# Patient Record
Sex: Male | Born: 1995
Health system: Southern US, Community
[De-identification: ages and names within clinical notes are randomized; demographics above are authoritative.]

## PROBLEM LIST (undated history)

## (undated) DIAGNOSIS — S060X9A Concussion with loss of consciousness of unspecified duration, initial encounter: Secondary | ICD-10-CM

## (undated) DIAGNOSIS — S060XAA Concussion with loss of consciousness status unknown, initial encounter: Secondary | ICD-10-CM

## (undated) DIAGNOSIS — Z8709 Personal history of other diseases of the respiratory system: Secondary | ICD-10-CM

## (undated) DIAGNOSIS — J329 Chronic sinusitis, unspecified: Secondary | ICD-10-CM

---
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
## 2015-09-21 ENCOUNTER — Encounter (HOSPITAL_COMMUNITY): Payer: Self-pay

## 2015-09-21 ENCOUNTER — Inpatient Hospital Stay (HOSPITAL_COMMUNITY)
Admission: AD | Admit: 2015-09-21 | Discharge: 2015-09-28 | DRG: 153 | Disposition: A | Discharge status: Skilled Nursing Facility | Payer: BLUE CROSS/BLUE SHIELD | Source: Other Acute Inpatient Hospital | Attending: Internal Medicine | Admitting: Internal Medicine

## 2015-09-21 DIAGNOSIS — M6282 Rhabdomyolysis: Secondary | ICD-10-CM | POA: Diagnosis present

## 2015-09-21 DIAGNOSIS — M791 Myalgia: Secondary | ICD-10-CM

## 2015-09-21 DIAGNOSIS — K5900 Constipation, unspecified: Secondary | ICD-10-CM | POA: Diagnosis present

## 2015-09-21 DIAGNOSIS — L0390 Cellulitis, unspecified: Secondary | ICD-10-CM | POA: Diagnosis present

## 2015-09-21 DIAGNOSIS — L040 Acute lymphadenitis of face, head and neck: Secondary | ICD-10-CM | POA: Diagnosis present

## 2015-09-21 DIAGNOSIS — R51 Headache: Secondary | ICD-10-CM

## 2015-09-21 DIAGNOSIS — L049 Acute lymphadenitis, unspecified: Secondary | ICD-10-CM

## 2015-09-21 DIAGNOSIS — K759 Inflammatory liver disease, unspecified: Secondary | ICD-10-CM

## 2015-09-21 DIAGNOSIS — R509 Fever, unspecified: Secondary | ICD-10-CM

## 2015-09-21 DIAGNOSIS — F1290 Cannabis use, unspecified, uncomplicated: Secondary | ICD-10-CM | POA: Diagnosis present

## 2015-09-21 DIAGNOSIS — J029 Acute pharyngitis, unspecified: Principal | ICD-10-CM | POA: Diagnosis present

## 2015-09-21 DIAGNOSIS — B9789 Other viral agents as the cause of diseases classified elsewhere: Secondary | ICD-10-CM

## 2015-09-21 DIAGNOSIS — R740 Nonspecific elevation of levels of transaminase and lactic acid dehydrogenase [LDH]: Secondary | ICD-10-CM | POA: Diagnosis not present

## 2015-09-21 DIAGNOSIS — T796XXA Traumatic ischemia of muscle, initial encounter: Secondary | ICD-10-CM | POA: Diagnosis not present

## 2015-09-21 DIAGNOSIS — G039 Meningitis, unspecified: Secondary | ICD-10-CM | POA: Insufficient documentation

## 2015-09-21 DIAGNOSIS — B349 Viral infection, unspecified: Secondary | ICD-10-CM | POA: Diagnosis not present

## 2015-09-21 DIAGNOSIS — K0889 Other specified disorders of teeth and supporting structures: Secondary | ICD-10-CM

## 2015-09-21 DIAGNOSIS — R109 Unspecified abdominal pain: Secondary | ICD-10-CM

## 2015-09-21 DIAGNOSIS — M542 Cervicalgia: Secondary | ICD-10-CM

## 2015-09-21 DIAGNOSIS — R630 Anorexia: Secondary | ICD-10-CM

## 2015-09-21 DIAGNOSIS — R52 Pain, unspecified: Secondary | ICD-10-CM

## 2015-09-21 DIAGNOSIS — J028 Acute pharyngitis due to other specified organisms: Secondary | ICD-10-CM

## 2015-09-21 HISTORY — DX: Chronic sinusitis, unspecified: J32.9

## 2015-09-21 HISTORY — DX: Concussion with loss of consciousness of unspecified duration, initial encounter: S06.0X9A

## 2015-09-21 HISTORY — DX: Concussion with loss of consciousness status unknown, initial encounter: S06.0XAA

## 2015-09-21 HISTORY — DX: Personal history of other diseases of the respiratory system: Z87.09

## 2015-09-21 LAB — COMPREHENSIVE METABOLIC PANEL
ALBUMIN: 3.6 g/dL (ref 3.5–5.0)
ALK PHOS: 56 U/L (ref 38–126)
ALT: 76 U/L — AB (ref 17–63)
ANION GAP: 11 (ref 5–15)
AST: 115 U/L — AB (ref 15–41)
BILIRUBIN TOTAL: 1 mg/dL (ref 0.3–1.2)
BUN: 8 mg/dL (ref 6–20)
CALCIUM: 9.6 mg/dL (ref 8.9–10.3)
CO2: 27 mmol/L (ref 22–32)
CREATININE: 1.2 mg/dL (ref 0.61–1.24)
Chloride: 100 mmol/L — ABNORMAL LOW (ref 101–111)
GFR calc Af Amer: >60 mL/min (ref >60)
GFR calc non Af Amer: >60 mL/min (ref >60)
GLUCOSE: 160 mg/dL — AB (ref 65–99)
Potassium: 4.9 mmol/L (ref 3.5–5.1)
Sodium: 138 mmol/L (ref 135–145)
TOTAL PROTEIN: 6.9 g/dL (ref 6.5–8.1)

## 2015-09-21 LAB — CBC
HEMATOCRIT: 41.1 % (ref 39.0–52.0)
HEMOGLOBIN: 14.6 g/dL (ref 13.0–17.0)
MCH: 29.1 pg (ref 26.0–34.0)
MCHC: 35.5 g/dL (ref 30.0–36.0)
MCV: 81.9 fL (ref 78.0–100.0)
Platelets: 223 10*3/uL (ref 150–400)
RBC: 5.02 MIL/uL (ref 4.22–5.81)
RDW: 13.3 % (ref 11.5–15.5)
WBC: 11.2 10*3/uL — ABNORMAL HIGH (ref 4.0–10.5)

## 2015-09-21 LAB — CK: Total CK: 3658 U/L — ABNORMAL HIGH (ref 49–397)

## 2015-09-21 LAB — HIV ANTIBODY (ROUTINE TESTING W REFLEX): HIV Screen 4th Generation wRfx: NONREACTIVE

## 2015-09-21 MED ORDER — DOXYCYCLINE HYCLATE 100 MG IV SOLR
100.0000 mg | Freq: Two times a day (BID) | INTRAVENOUS | Status: DC
  Filled 2015-09-21: qty 100

## 2015-09-21 MED ORDER — SODIUM CHLORIDE 0.9 % IV SOLN
INTRAVENOUS | Status: DC
  Administered 2015-09-21 – 2015-09-24 (×5): via INTRAVENOUS

## 2015-09-21 MED ORDER — OXYCODONE HCL 5 MG PO TABS
5.0000 mg | ORAL_TABLET | ORAL | Status: DC | PRN
  Administered 2015-09-21 (×3): 5 mg via ORAL
  Filled 2015-09-21 (×4): qty 1

## 2015-09-21 MED ORDER — ONDANSETRON HCL 4 MG/2ML IJ SOLN
4.0000 mg | Freq: Four times a day (QID) | INTRAMUSCULAR | Status: DC | PRN
Start: 2015-09-21 — End: 2015-09-28

## 2015-09-21 MED ORDER — HYDROMORPHONE HCL 1 MG/ML IJ SOLN
1.0000 mg | INTRAMUSCULAR | Status: DC | PRN
  Administered 2015-09-21 (×4): 1 mg via INTRAVENOUS
  Filled 2015-09-21 (×4): qty 1

## 2015-09-21 MED ORDER — ACETAMINOPHEN 650 MG RE SUPP: 650.0000 mg | Freq: Four times a day (QID) | RECTAL | Status: DC | PRN

## 2015-09-21 MED ORDER — VANCOMYCIN HCL IN DEXTROSE 1-5 GM/200ML-% IV SOLN
1000.0000 mg | Freq: Three times a day (TID) | INTRAVENOUS | Status: DC
  Administered 2015-09-21: 1000 mg via INTRAVENOUS
  Filled 2015-09-21 (×3): qty 200

## 2015-09-21 MED ORDER — ONDANSETRON HCL 4 MG PO TABS: 4.0000 mg | ORAL_TABLET | Freq: Four times a day (QID) | ORAL | Status: DC | PRN

## 2015-09-21 MED ORDER — ACETAMINOPHEN 325 MG PO TABS: 650.0000 mg | ORAL_TABLET | Freq: Four times a day (QID) | ORAL | Status: DC | PRN

## 2015-09-21 MED ORDER — METHOCARBAMOL 500 MG PO TABS
500.0000 mg | ORAL_TABLET | Freq: Four times a day (QID) | ORAL | Status: DC | PRN
  Administered 2015-09-22: 500 mg via ORAL
  Filled 2015-09-21: qty 1

## 2015-09-21 MED ORDER — SODIUM CHLORIDE 0.9% FLUSH
3.0000 mL | Freq: Two times a day (BID) | INTRAVENOUS | Status: DC
  Administered 2015-09-21 – 2015-09-27 (×11): 3 mL via INTRAVENOUS

## 2015-09-21 MED ORDER — DEXTROSE 5 % IV SOLN
2.0000 g | Freq: Two times a day (BID) | INTRAVENOUS | Status: DC
  Administered 2015-09-21: 2 g via INTRAVENOUS
  Filled 2015-09-21 (×2): qty 2

## 2015-09-21 MED ORDER — ENOXAPARIN SODIUM 40 MG/0.4ML ~~LOC~~ SOLN
40.0000 mg | SUBCUTANEOUS | Status: DC
  Administered 2015-09-21 – 2015-09-27 (×6): 40 mg via SUBCUTANEOUS
  Filled 2015-09-21 (×7): qty 0.4

## 2015-09-21 MED ORDER — DEXTROSE 5 % IV SOLN
10.0000 mg/kg | Freq: Three times a day (TID) | INTRAVENOUS | Status: DC
  Administered 2015-09-21: 625 mg via INTRAVENOUS
  Filled 2015-09-21 (×3): qty 12.5

## 2015-09-21 NOTE — Progress Notes (Signed)
ANTIBIOTIC CONSULT NOTE - INITIAL  Pharmacy Consult for Rocephin / Vancomycin Indication: meningitis  Patient Measurements: Height: 5\' 10"  (177.8 cm) Weight: 138 lb 3.2 oz (62.687 kg) (Scale A) IBW/kg (Calculated) : 73   Vital Signs: Temp: 98.2 F (36.8 C) (01/28 0100) Temp Source: Oral (01/28 0100) BP: 139/75 mmHg (01/28 0100) Pulse Rate: 83 (01/28 0100)   Labs: No results for input(s): WBC, HGB, PLT, LABCREA, CREATININE in the last 72 hours. CrCl cannot be calculated (Patient has no serum creatinine result on file.). No results for input(s): VANCOTROUGH, VANCOPEAK, VANCORANDOM, GENTTROUGH, GENTPEAK, GENTRANDOM, TOBRATROUGH, TOBRAPEAK, TOBRARND, AMIKACINPEAK, AMIKACINTROU, AMIKACIN in the last 72 hours.   Microbiology: No results found for this or any previous visit (from the past 720 hour(s)).  Medical History: History reviewed. No pertinent past medical history.  Assessment: 20 year old male to begin Rocephin and Vancomycin for possible meningitis  Goal of Therapy:  Vancomycin trough level 15-20 mcg/ml  Appropriate Rocephin dosing  Plan:  Rocephin 2 grams iv Q 12 hours Vancomycin 1 gram iv Q 8 hours Follow up Scr, plan  Thank you Anette Guarneri, PharmD [phone]  09/21/2015,3:49 AM

## 2015-09-21 NOTE — H&P (Signed)
History and Physical  Patient Name: William Cordova     [id number]    DOB: Feb 14, 1996    DOA: 09/21/2015 Referring physician: Gloriann Loan, DO at [facility] PCP: Angelina Sheriff., MD      Chief Complaint: Headache   HPI: William Cordova is a 20 y.o. male with a past medical history significant for concussion who presents with 3 days headache and fever.  The patient was in his usual state of health until about three days ago when he developed posterior neck pain.  This progressed the next day to headache and low grade fever, and then today to severe neck ache, back ache, pain with swallowing, headache, and fever alternating with drenching sweats and chills.  The pain was more severe than a broken bone, so he had his girlfriend drive him home from college at [employer] to go to the hospital.    Of note, the patient was in the [place] on a cruise ship three weeks ago.  During that time, he did excursions onto the island, including one cave exploration with a group and guide, in close proximity to bats.  He had a sore throat during that trip, but nothing else.  He has had no other travel in the last year outside the [place] ([place], [place], and [place]).  No recent mountain travel.  No illicit drugs other than THC.  In the ED, the patient had fever to 101F, mild tachycardia, mildly elevated transaminases and leukocytosis.  An LP was traumatic with only 1 WBC and normal glucose and protein.  This was sent for culture and HSV PCR, and empiric vancomycin and ceftriaxone were started.  The case was discussed with [facility] by phone, who recommended transfer for evaluation.     Review of Systems:  Pt complains of neck pain, back pain, thigh pain, pain with walking, pain with any movement, fever, drenching sweats, abdominal pain.   Pt denies any joint pain, rashes.  All other systems negative except as just noted or noted in the history of present illness.  No Known Allergies  Prior to Admission medications     Not on File    Past Medical History  Diagnosis Date  . Concussion     History reviewed. No pertinent past surgical history.  Family history: family history includes Cancer in his paternal grandmother; Heart attack in his paternal grandfather.  Social History: Patient lives in [place] and is a Ship broker at [employer].  He does not smoke.  THC use but not other illicit drugs.  Travel as described above.       Physical Exam: BP 139/75 mmHg  Pulse 83  Temp(Src) 98.2 F (36.8 C) (Oral)  Resp 15  Ht 5\' 10"  (1.778 m)  Wt 62.687 kg (138 lb 3.2 oz)  BMI 19.83 kg/m2  SpO2 96% General appearance: Well-developed, adult male, alert and in mild distress from pain.   Eyes: Anicteric, conjunctiva pink, lids and lashes normal.     ENT: No nasal deformity, discharge, or epistaxis.  OP moist without lesions.  Painful to open mouth far. Lymph: No cervical or supraclavicular lymphadenopathy. Skin: Warm and dry.  No jaundice.  Question faint red papular rash on chest, behind ears.  None on extremities, hands, legs.  Mother points out three old hypopigmented macules/patches on back. Cardiac: RRR, nl S1-S2, no murmurs appreciated.  Capillary refill is brisk.  No LE edema.  Radial and DP pulses 2+ and symmetric. Respiratory: Normal respiratory rate and rhythm.  CTAB  without rales or wheezes. Abdomen: Abdomen soft without rigidity.  Mild RUQ TTP with lots of voluntary guarding. No ascites, distension.   MSK: No deformities or effusions. Neuro: Sensorium intact and responding to questions, attention normal.  Speech is fluent.  Moves all extremities equally and with normal coordination.    Psych: Behavior appropriate.  Affect in pain.  No evidence of aural or visual hallucinations or delusions.       Labs on Admission:  The metabolic panel shows normal sodium, potassium, bicarbonate, and renal function. Osmolalities somewhat low. Urinalysis is clear.  UDS positive for THC only. Lactic acid level  normal. Lumbar puncture shows 1 CSF WBC, many RBCs, Gram stain negative. Flu and strep rapid test are negative.  AST and ALT are 163 and 108 U/L, respectively. Total bilirubin mildly elevated. The complete blood count shows leukocytosis without anemia or thrombocytopenia.   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  CT head without contrast at outside hospital 09/21/2015: NAICP     Assessment/Plan 1. Headache, fever, sweats and body ache:  This is new.  The differential includes viral meningitis, HSV encephalitis, bacterial meningitis (doubted given tap, but coverage empirically for now), and tropical fevers (chikungunya, Dengue, Zika, malaria).  CT of the head and abdomen are unremarkable.  LP traumatic, culture and HSV PCR pending. -Supportive care with MIVF and pain control -Follow culture data and HSV PCR from [facility] -Thin smears -HIV -Consult to ID, appreciate cares   2. LFTs:  Suspect due to #1 above. -Trend CMP      DVT PPx: Lovenox Diet: Regular Consultants: ID Code Status: Full Family Communication: Mother, present at bedside  Medical decision making: What exists of the patient's previous chart from today at [facility] was reviewed. Patient seen 4:34 AM on 09/21/2015.  Disposition Plan:  I recommend admission to telemetry.  Clinical condition: stable.  Anticipate work up with infectious disease guidance, empiric coverage for now, de-escalation if possible.      Edwin Dada [facility] Pager [phone]

## 2015-09-21 NOTE — Progress Notes (Addendum)
Patient admmited after midnight- check CK (recently increased the amount of weights/intensity of exercise) -prob viral illness -labs in AM  William Hetz DO  CK level elevated- will increase IVF -updated patient and nurse  Later called by mother wanting a CT scan of neck apparently.  Explained for the 3rd time that is not yet indicated.

## 2015-09-21 NOTE — Consult Note (Signed)
    [place] for Infectious Disease       Reason for Consult: fever    Referring Physician: Dr. Eliseo Squires  Principal Problem:   Meningitis   . enoxaparin (LOVENOX) injection  40 mg Subcutaneous Q24H  . sodium chloride flush  3 mL Intravenous Q12H    Recommendations: Supportive care Will add on CMV, EBV   Assessment: He has febrile illness with myalgias, headache diffusely, poor po, mild transaminitis, abdominal discomfort without diarrhea, sore throat.  DDx is broad but largely viral etiology.  Not c/w typhoid fever, possible EBV/CMV, ILI.  Dengue possible but less likely with no overt joint complaints or periorbital headache, chikungunya not likely without joint swelling.  Meningitis r/o with no WBC on CSF, not c/w encephalitis with no confusion, normal glucose and protein.     Antibiotics: Has received ceftriaxone, steroids and vancomyin  HPI: William Cordova is a 20 y.o. male with no known past medical history who presented to [facility] with fever, diffuse muscle aches, sore throat, abdominal discomfort.  Has been ongoing for 2 days, no sick contacts, no diarrhea, no arthralgias, neck pain with headache diffusely, some nausea but no vomiting.  Recently traveled to [place] on cruise, spent some time on the island and went to 2 cities, cave.  Does not recall any mosquito bites.   Labs, ct reviewed from [facility].  CSF WBC 1, normal glucose and protein.    Review of Systems:  Constitutional: positive for fevers, chills, sweats, fatigue, malaise and anorexia Cardiovascular: negative except for fatigue Gastrointestinal: negative for diarrhea Musculoskeletal: positive for myalgias, neck pain and back pain, negative for arthralgias and stiff joints All other systems reviewed and are negative   Past Medical History  Diagnosis Date  . Concussion     Social History  Substance Use Topics  . Smoking status: Never Smoker   . Smokeless tobacco: None  . Alcohol Use: Yes    Family  History  Problem Relation Age of Onset  . Heart attack Paternal Grandfather   . Cancer Paternal Grandmother     No Known Allergies  Physical Exam: Constitutional: in no apparent distress and alert  Filed Vitals:   09/21/15 0511 09/21/15 0812  BP: 105/49 108/76  Pulse: 63 62  Temp: 98.3 F (36.8 C) 98 F (36.7 C)  Resp: 15 16   EYES: anicteric ENMT: no thrush, cannot open mouth without pain Cardiovascular: Cor RRR and No murmurs Respiratory: CTA B; normal respiratory effort GI: Bowel sounds are normal, liver is not enlarged, spleen is not enlarged Musculoskeletal: no pedal edema noted Skin: negatives: no rash Hematologic: some anterior cervical lad  Lab Results  Component Value Date   WBC 11.2* 09/21/2015   HGB 14.6 09/21/2015   HCT 41.1 09/21/2015   MCV 81.9 09/21/2015   PLT 223 09/21/2015    Lab Results  Component Value Date   CREATININE 1.20 09/21/2015   BUN 8 09/21/2015   NA 138 09/21/2015   K 4.9 09/21/2015   CL 100* 09/21/2015   CO2 27 09/21/2015    Lab Results  Component Value Date   ALT 76* 09/21/2015   AST 115* 09/21/2015   ALKPHOS 56 09/21/2015     Microbiology: No results found for this or any previous visit (from the past 240 hour(s)).  Scharlene Gloss, [place] for Infectious Disease [place] [email or web address] [phone] pager  [phone] cell 09/21/2015, 11:02 AM

## 2015-09-22 ENCOUNTER — Inpatient Hospital Stay (HOSPITAL_COMMUNITY): Payer: BLUE CROSS/BLUE SHIELD

## 2015-09-22 ENCOUNTER — Encounter (HOSPITAL_COMMUNITY): Payer: Self-pay | Admitting: Internal Medicine

## 2015-09-22 DIAGNOSIS — B9789 Other viral agents as the cause of diseases classified elsewhere: Secondary | ICD-10-CM

## 2015-09-22 DIAGNOSIS — M542 Cervicalgia: Secondary | ICD-10-CM

## 2015-09-22 DIAGNOSIS — J028 Acute pharyngitis due to other specified organisms: Secondary | ICD-10-CM

## 2015-09-22 DIAGNOSIS — M6282 Rhabdomyolysis: Secondary | ICD-10-CM

## 2015-09-22 LAB — EBV AB TO VIRAL CAPSID AG PNL, IGG+IGM: EBV VCA IGG: 34.3 U/mL — AB (ref 0.0–17.9)

## 2015-09-22 LAB — HEPATITIS A ANTIBODY, IGM: Hep A IgM: NEGATIVE

## 2015-09-22 LAB — COMPREHENSIVE METABOLIC PANEL
ALBUMIN: 2.7 g/dL — AB (ref 3.5–5.0)
ALK PHOS: 42 U/L (ref 38–126)
ALT: 54 U/L (ref 17–63)
ANION GAP: 11 (ref 5–15)
AST: 69 U/L — AB (ref 15–41)
BUN: 6 mg/dL (ref 6–20)
CO2: 26 mmol/L (ref 22–32)
Calcium: 8.7 mg/dL — ABNORMAL LOW (ref 8.9–10.3)
Chloride: 104 mmol/L (ref 101–111)
Creatinine, Ser: 1.05 mg/dL (ref 0.61–1.24)
GFR calc Af Amer: >60 mL/min (ref >60)
GFR calc non Af Amer: >60 mL/min (ref >60)
GLUCOSE: 96 mg/dL (ref 65–99)
POTASSIUM: 4.3 mmol/L (ref 3.5–5.1)
SODIUM: 141 mmol/L (ref 135–145)
Total Bilirubin: 0.4 mg/dL (ref 0.3–1.2)
Total Protein: 5.1 g/dL — ABNORMAL LOW (ref 6.5–8.1)

## 2015-09-22 LAB — CBC
HEMATOCRIT: 33.4 % — AB (ref 39.0–52.0)
HEMOGLOBIN: 11.7 g/dL — AB (ref 13.0–17.0)
MCH: 29 pg (ref 26.0–34.0)
MCHC: 35 g/dL (ref 30.0–36.0)
MCV: 82.9 fL (ref 78.0–100.0)
Platelets: 201 10*3/uL (ref 150–400)
RBC: 4.03 MIL/uL — AB (ref 4.22–5.81)
RDW: 13.3 % (ref 11.5–15.5)
WBC: 7.4 10*3/uL (ref 4.0–10.5)

## 2015-09-22 LAB — CMV IGM: CMV IgM: <30 AU/mL (ref 0.0–29.9)

## 2015-09-22 LAB — CK: Total CK: 2676 U/L — ABNORMAL HIGH (ref 49–397)

## 2015-09-22 MED ORDER — HYDROMORPHONE HCL 1 MG/ML IJ SOLN
0.5000 mg | Freq: Once | INTRAMUSCULAR | Status: AC
Start: 2015-09-22 — End: 2015-09-22
  Administered 2015-09-22: 0.5 mg via INTRAVENOUS
  Filled 2015-09-22: qty 1

## 2015-09-22 MED ORDER — CLINDAMYCIN HCL 300 MG PO CAPS
600.0000 mg | ORAL_CAPSULE | Freq: Four times a day (QID) | ORAL | Status: DC
  Administered 2015-09-22 – 2015-09-23 (×2): 600 mg via ORAL
  Filled 2015-09-22 (×2): qty 2

## 2015-09-22 MED ORDER — DIPHENHYDRAMINE HCL 25 MG PO CAPS
25.0000 mg | ORAL_CAPSULE | Freq: Four times a day (QID) | ORAL | Status: DC | PRN
  Administered 2015-09-22 – 2015-09-23 (×2): 25 mg via ORAL
  Filled 2015-09-22 (×2): qty 1

## 2015-09-22 MED ORDER — HYDROMORPHONE HCL 1 MG/ML IJ SOLN
0.5000 mg | INTRAMUSCULAR | Status: DC | PRN
  Administered 2015-09-22 – 2015-09-26 (×17): 0.5 mg via INTRAVENOUS
  Filled 2015-09-22 (×18): qty 1

## 2015-09-22 MED ORDER — TRAMADOL HCL 50 MG PO TABS
50.0000 mg | ORAL_TABLET | Freq: Four times a day (QID) | ORAL | Status: DC | PRN
  Administered 2015-09-22: 50 mg via ORAL
  Filled 2015-09-22: qty 1

## 2015-09-22 MED ORDER — INFLUENZA VAC SPLIT QUAD 0.5 ML IM SUSY
0.5000 mL | PREFILLED_SYRINGE | INTRAMUSCULAR | Status: AC
  Administered 2015-09-27: 0.5 mL via INTRAMUSCULAR
  Filled 2015-09-22 (×2): qty 0.5

## 2015-09-22 MED ORDER — CLINDAMYCIN HCL 300 MG PO CAPS
300.0000 mg | ORAL_CAPSULE | Freq: Four times a day (QID) | ORAL | Status: DC
  Administered 2015-09-22: 300 mg via ORAL
  Filled 2015-09-22: qty 1

## 2015-09-22 MED ORDER — OXYCODONE-ACETAMINOPHEN 5-325 MG PO TABS
1.0000 | ORAL_TABLET | Freq: Four times a day (QID) | ORAL | Status: DC | PRN
  Administered 2015-09-23 – 2015-09-26 (×12): 1 via ORAL
  Filled 2015-09-22 (×12): qty 1

## 2015-09-22 MED ORDER — IBUPROFEN 600 MG PO TABS
600.0000 mg | ORAL_TABLET | Freq: Three times a day (TID) | ORAL | Status: DC
  Administered 2015-09-22 – 2015-09-25 (×11): 600 mg via ORAL
  Filled 2015-09-22 (×12): qty 1

## 2015-09-22 MED ORDER — PHENOL 1.4 % MT LIQD
1.0000 | OROMUCOSAL | Status: DC | PRN
  Filled 2015-09-22: qty 177

## 2015-09-22 NOTE — Progress Notes (Signed)
Pt lying in bed, mother and two other members at the bedside spoke to the patient in detail about pain medication regimen. Awaiting kpad from portable. Verbalized understanding and requesting to talk with the MD once pts father is present MD made aware of concerns and requests.

## 2015-09-22 NOTE — Progress Notes (Addendum)
PROGRESS NOTE  William Cordova [id number] DOB: Apr 30, 1996 DOA: 09/21/2015 PCP: Angelina Sheriff., MD  Assessment/Plan: Headache, fever, sweats and body ache:  Suspect viral- d/c all abx (no fever) -CMV, EBV -HIV negative - ID  Neck pain-severe in nature -no neurologic symptoms of numbness or weakness -CT scan done at [facility] demanding cervical MRI  MRI shows pharyngitis with small phlegmon- Start clindmyacin as has been on amoxicillin and augmentin in last 30 days -will touch base with ENT in AM -no stridor or drooling to suggest Retropharyngeal abscess  LFTs:  Suspect due rhabdo  rhabdo- recently increased work out -IVF -muscle relaxer -CK decreasing  THC counseled on cessation  Code Status: full Family Communication: patient Disposition Plan:    Consultants:  ID  Procedures:      HPI/Subjective: Patient did not want to wake up-- "I'm too tired"  Objective: Filed Vitals:   09/22/15 0459 09/22/15 1119  BP: 115/71 111/58  Pulse: 48 53  Temp: 98.3 F (36.8 C) 97.6 F (36.4 C)  Resp: 16 16    Intake/Output Summary (Last 24 hours) at 09/22/15 1211 Last data filed at 09/22/15 1033  Gross per 24 hour  Intake 4366.17 ml  Output   1575 ml  Net 2791.17 ml   Filed Weights   09/21/15 0100 09/22/15 0459  Weight: 62.687 kg (138 lb 3.2 oz) 66.089 kg (145 lb 11.2 oz)    Exam:   General:  sleeping  Cardiovascular: rrr  Respiratory: clear  Neck- tender to palpation  Data Reviewed: Basic Metabolic Panel:  Recent Labs Lab 09/21/15 0417 09/22/15 0345  NA 138 141  K 4.9 4.3  CL 100* 104  CO2 27 26  GLUCOSE 160* 96  BUN 8 6  CREATININE 1.20 1.05  CALCIUM 9.6 8.7*   Liver Function Tests:  Recent Labs Lab 09/21/15 0417 09/22/15 0345  AST 115* 69*  ALT 76* 54  ALKPHOS 56 42  BILITOT 1.0 0.4  PROT 6.9 5.1*  ALBUMIN 3.6 2.7*   No results for input(s): LIPASE, AMYLASE in the last 168 hours. No results for input(s):  AMMONIA in the last 168 hours. CBC:  Recent Labs Lab 09/21/15 0417 09/22/15 0345  WBC 11.2* 7.4  HGB 14.6 11.7*  HCT 41.1 33.4*  MCV 81.9 82.9  PLT 223 201   Cardiac Enzymes:  Recent Labs Lab 09/21/15 1245 09/22/15 0345  CKTOTAL 3658* 2676*   BNP (last 3 results) No results for input(s): BNP in the last 8760 hours.  ProBNP (last 3 results) No results for input(s): PROBNP in the last 8760 hours.  CBG: No results for input(s): GLUCAP in the last 168 hours.  No results found for this or any previous visit (from the past 240 hour(s)).   Studies: No results found.  Scheduled Meds: . enoxaparin (LOVENOX) injection  40 mg Subcutaneous Q24H  . sodium chloride flush  3 mL Intravenous Q12H   Continuous Infusions: . sodium chloride 150 mL/hr at 09/21/15 2317   Antibiotics Given (last 72 hours)    Date/Time Action Medication Dose Rate   09/21/15 0503 Given   cefTRIAXone (ROCEPHIN) 2 g in dextrose 5 % 50 mL IVPB 2 g 100 mL/hr   09/21/15 0602 Given   acyclovir (ZOVIRAX) 625 mg in dextrose 5 % 100 mL IVPB 625 mg 112.5 mL/hr   09/21/15 0659 Given  [Meds scheduled too close together]   vancomycin (VANCOCIN) IVPB 1000 mg/200 mL premix 1,000 mg 200 mL/hr      Active Problems:  Neck pain   Sore throat (viral)    Time spent: 25 min    [facility] Pager [phone] 7PM-7AM, please contact night-coverage at [email or web address], password [facility] 09/22/2015, 12:11 PM  LOS: 1 day

## 2015-09-22 NOTE — Progress Notes (Signed)
Pain has localized more the right side per patient- very tender to palpation and pain 10/10.  Family also reports numerous strep throat infections in past and talks of removing tonsils Eulogio Bear DO

## 2015-09-22 NOTE — Progress Notes (Signed)
Pt sitting up in bed with girlfriend in bed working on homework. Pain medication given

## 2015-09-23 LAB — CBC
HEMATOCRIT: 36.8 % — AB (ref 39.0–52.0)
Hemoglobin: 12.3 g/dL — ABNORMAL LOW (ref 13.0–17.0)
MCH: 27.9 pg (ref 26.0–34.0)
MCHC: 33.4 g/dL (ref 30.0–36.0)
MCV: 83.4 fL (ref 78.0–100.0)
Platelets: 209 10*3/uL (ref 150–400)
RBC: 4.41 MIL/uL (ref 4.22–5.81)
RDW: 13.2 % (ref 11.5–15.5)
WBC: 5.6 10*3/uL (ref 4.0–10.5)

## 2015-09-23 LAB — BASIC METABOLIC PANEL
ANION GAP: 8 (ref 5–15)
BUN: 6 mg/dL (ref 6–20)
CALCIUM: 8.7 mg/dL — AB (ref 8.9–10.3)
CO2: 25 mmol/L (ref 22–32)
Chloride: 107 mmol/L (ref 101–111)
Creatinine, Ser: 0.92 mg/dL (ref 0.61–1.24)
GFR calc Af Amer: >60 mL/min (ref >60)
GLUCOSE: 87 mg/dL (ref 65–99)
Potassium: 4.2 mmol/L (ref 3.5–5.1)
Sodium: 140 mmol/L (ref 135–145)

## 2015-09-23 LAB — CK: CK TOTAL: 1871 U/L — AB (ref 49–397)

## 2015-09-23 MED ORDER — SENNOSIDES-DOCUSATE SODIUM 8.6-50 MG PO TABS
1.0000 | ORAL_TABLET | Freq: Two times a day (BID) | ORAL | Status: DC
  Administered 2015-09-23 – 2015-09-28 (×11): 1 via ORAL
  Filled 2015-09-23 (×11): qty 1

## 2015-09-23 MED ORDER — CLINDAMYCIN PHOSPHATE 600 MG/50ML IV SOLN
600.0000 mg | Freq: Three times a day (TID) | INTRAVENOUS | Status: DC
  Administered 2015-09-23 – 2015-09-26 (×9): 600 mg via INTRAVENOUS
  Filled 2015-09-23 (×9): qty 50

## 2015-09-23 NOTE — Progress Notes (Signed)
PROGRESS NOTE  William Cordova [id number] DOB: Jul 13, 1996 DOA: 09/21/2015 PCP: Angelina Sheriff., MD  Assessment/Plan: Headache, fever, sweats and body ache:  -HIV negative - ID- has seen and signed off  Neck pain-severe in nature -no neurologic symptoms of numbness or weakness -CT scan done at [facility]  MRI shows pharyngitis with small phlegmon- Start clindmyacin as has been on amoxicillin and augmentin in last 30 days -appreciate ENT -no stridor or drooling to suggest Retropharyngeal abscess  LFTs:  Suspect due rhabdo -resolved  rhabdo- recently increased work out -IVF -CK decreasing  THC counseled on cessation  Code Status: full Family Communication: patient/mom Disposition Plan:    Consultants:  ID  ENT  Procedures:      HPI/Subjective: Said throat was better but then said it was about the same Eating more   Objective: Filed Vitals:   09/23/15 0519 09/23/15 1136  BP: 102/56 106/62  Pulse: 54 49  Temp: 97.7 F (36.5 C) 98.1 F (36.7 C)  Resp: 16 18    Intake/Output Summary (Last 24 hours) at 09/23/15 1150 Last data filed at 09/23/15 1143  Gross per 24 hour  Intake   3600 ml  Output   2700 ml  Net    900 ml   Filed Weights   09/21/15 0100 09/22/15 0459 09/23/15 0519  Weight: 62.687 kg (138 lb 3.2 oz) 66.089 kg (145 lb 11.2 oz) 66.271 kg (146 lb 1.6 oz)    Exam:   General:  Awake, doing home work on his computer  Neck- tender to palpation  No edema LE  Data Reviewed: Basic Metabolic Panel:  Recent Labs Lab 09/21/15 0417 09/22/15 0345 09/23/15 0530  NA 138 141 140  K 4.9 4.3 4.2  CL 100* 104 107  CO2 27 26 25   GLUCOSE 160* 96 87  BUN 8 6 6   CREATININE 1.20 1.05 0.92  CALCIUM 9.6 8.7* 8.7*   Liver Function Tests:  Recent Labs Lab 09/21/15 0417 09/22/15 0345  AST 115* 69*  ALT 76* 54  ALKPHOS 56 42  BILITOT 1.0 0.4  PROT 6.9 5.1*  ALBUMIN 3.6 2.7*   No results for input(s): LIPASE, AMYLASE in the last  168 hours. No results for input(s): AMMONIA in the last 168 hours. CBC:  Recent Labs Lab 09/21/15 0417 09/22/15 0345 09/23/15 0530  WBC 11.2* 7.4 5.6  HGB 14.6 11.7* 12.3*  HCT 41.1 33.4* 36.8*  MCV 81.9 82.9 83.4  PLT 223 201 209   Cardiac Enzymes:  Recent Labs Lab 09/21/15 1245 09/22/15 0345 09/23/15 0530  CKTOTAL 3658* 2676* 1871*   BNP (last 3 results) No results for input(s): BNP in the last 8760 hours.  ProBNP (last 3 results) No results for input(s): PROBNP in the last 8760 hours.  CBG: No results for input(s): GLUCAP in the last 168 hours.  No results found for this or any previous visit (from the past 240 hour(s)).   Studies: Mr Cervical Spine Wo Contrast  09/22/2015  CLINICAL DATA:  Fever and diffuse neck pain, 2 days duration. EXAM: MRI CERVICAL SPINE WITHOUT CONTRAST TECHNIQUE: Multiplanar, multisequence MR imaging of the cervical spine was performed. No intravenous contrast was administered. COMPARISON:  None. FINDINGS: Alignment of the spine is normal. No degenerative change. Spinal canal and foramina are widely patent. No cord lesion. Abnormal fluid in the retropharyngeal/prevertebral space extending from C2 to C6 consistent with pharyngitis. Reactive cervical lymphadenopathy is noted including a suppurative node on the right between the vessels and the longus coli  muscle measuring 3 cm in length with a transverse diameter of 14 mm, consistent with the retropharyngeal node of Rouviere. IMPRESSION: Suppurative retropharyngeal lymph node of Rouviere on the right with retropharyngeal phlegmonous inflammation/effusion. Electronically Signed   By: Nelson Chimes M.D.   On: 09/22/2015 14:44    Scheduled Meds: . clindamycin (CLEOCIN) IV  600 mg Intravenous 3 times per day  . enoxaparin (LOVENOX) injection  40 mg Subcutaneous Q24H  . ibuprofen  600 mg Oral TID  . Influenza vac split quadrivalent PF  0.5 mL Intramuscular Tomorrow-1000  . senna-docusate  1 tablet Oral  BID  . sodium chloride flush  3 mL Intravenous Q12H   Continuous Infusions: . sodium chloride 150 mL/hr at 09/23/15 0302   Antibiotics Given (last 72 hours)    Date/Time Action Medication Dose Rate   09/21/15 0503 Given   cefTRIAXone (ROCEPHIN) 2 g in dextrose 5 % 50 mL IVPB 2 g 100 mL/hr   09/21/15 0602 Given   acyclovir (ZOVIRAX) 625 mg in dextrose 5 % 100 mL IVPB 625 mg 112.5 mL/hr   09/21/15 0659 Given  [Meds scheduled too close together]   vancomycin (VANCOCIN) IVPB 1000 mg/200 mL premix 1,000 mg 200 mL/hr   09/22/15 1606 Given   clindamycin (CLEOCIN) capsule 300 mg 300 mg    09/22/15 2357 Given   clindamycin (CLEOCIN) capsule 600 mg 600 mg    09/23/15 0535 Given   clindamycin (CLEOCIN) capsule 600 mg 600 mg       Active Problems:   Neck pain   Sore throat (viral)   Rhabdomyolysis    Time spent: 15 min    [facility] Pager [phone] 7PM-7AM, please contact night-coverage at [email or web address], password [facility] 09/23/2015, 11:50 AM  LOS: 2 days

## 2015-09-23 NOTE — Consult Note (Signed)
Reason for Consult:Neck pain, lymphadenitis Referring Physician: Hospitalist  William Cordova is an 20 y.o. male.  HPI: 20 year old male who developed posterior neck stiffness and pain five days ago.  He thought he had pulled a muscle working out.  By the next day, pain worsened significantly and he was unable to drive.  He was driven home from college the following day and was taken to the ER at [place] where Mono and flu testing were negative.  He had associated throat pain, pain with swallowing, low grade fever, and headache.  An LP was performed but was negative.  He was transferred for further evaluation and treated with empiric vanocomycin and Rocephin.  Pain continued through the weekend to the point of tears.  A cervical MRI was performed yesterday that demonstrated inflammatory lymphadenitis with a suppurative node in the right retropharynx and possible early fluid collection.  Today, pain continues with stiffness of the neck.  He notes tinnitus.  He slept several hours last night and last required pain medicine at 8 pm.  Past Medical History  Diagnosis Date  . Concussion   . Frequent sinus infections   . History of strep sore throat     History reviewed. No pertinent past surgical history.  Family History  Problem Relation Age of Onset  . Heart attack Paternal Grandfather   . Cancer Paternal Grandmother     Social History:  reports that he has never smoked. He does not have any smokeless tobacco history on file. He reports that he drinks alcohol. His drug history is not on file.  Allergies: No Known Allergies  Medications: I have reviewed the patient's current medications.  Results for orders placed or performed during the hospital encounter of 09/21/15 (from the past 48 hour(s))  EBV ab to viral capsid ag pnl, IgG+IgM     Status: Abnormal   Collection Time: 09/21/15 12:45 PM  Result Value Ref Range   EBV VCA IgG 34.3 (H) 0.0 - 17.9 U/mL    Comment: (NOTE)              Negative        <18.0                                 Equivocal 18.0 - 21.9                                 Positive        >21.9 Performed At: [facility] [place], [place] [id number] Lindon Romp MD IW:[id number]    EBV VCA IgM <36.0 0.0 - 35.9 U/mL    Comment: (NOTE)                                 Negative        <36.0                                 Equivocal 36.0 - 43.9                                 Positive        >43.9   CMV IgM  Status: None   Collection Time: 09/21/15 12:45 PM  Result Value Ref Range   CMV IgM <30.0 0.0 - 29.9 AU/mL    Comment: (NOTE)                                Negative         <30.0                                Equivocal  30.0 - 34.9                                Positive         >34.9 A positive result is generally indicative of acute infection, reactivation or persistent IgM production. Performed At: [facility] [place], [place] [id number] Lindon Romp MD OZ:[id number]   Hepatitis A antibody, IgM     Status: None   Collection Time: 09/21/15 12:45 PM  Result Value Ref Range   Hep A IgM Negative Negative    Comment: (NOTE) Performed At: [facility] [place], [place] [id number] Lindon Romp MD [id number]   CK     Status: Abnormal   Collection Time: 09/21/15 12:45 PM  Result Value Ref Range   Total CK 3658 (H) 49 - 397 U/L  Comprehensive metabolic panel     Status: Abnormal   Collection Time: 09/22/15  3:45 AM  Result Value Ref Range   Sodium 141 135 - 145 mmol/L   Potassium 4.3 3.5 - 5.1 mmol/L   Chloride 104 101 - 111 mmol/L   CO2 26 22 - 32 mmol/L   Glucose, Bld 96 65 - 99 mg/dL   BUN 6 6 - 20 mg/dL   Creatinine, Ser 1.05 0.61 - 1.24 mg/dL   Calcium 8.7 (L) 8.9 - 10.3 mg/dL   Total Protein 5.1 (L) 6.5 - 8.1 g/dL   Albumin 2.7 (L) 3.5 - 5.0 g/dL   AST 69 (H) 15 - 41 U/L   ALT 54 17 - 63 U/L   Alkaline Phosphatase 42 38 - 126  U/L   Total Bilirubin 0.4 0.3 - 1.2 mg/dL   GFR calc non Af Amer >60 >60 mL/min   GFR calc Af Amer >60 >60 mL/min    Comment: (NOTE) The eGFR has been calculated using the CKD EPI equation. This calculation has not been validated in all clinical situations. eGFR's persistently <60 mL/min signify possible Chronic Kidney Disease.    Anion gap 11 5 - 15  CK     Status: Abnormal   Collection Time: 09/22/15  3:45 AM  Result Value Ref Range   Total CK 2676 (H) 49 - 397 U/L  CBC     Status: Abnormal   Collection Time: 09/22/15  3:45 AM  Result Value Ref Range   WBC 7.4 4.0 - 10.5 K/uL   RBC 4.03 (L) 4.22 - 5.81 MIL/uL   Hemoglobin 11.7 (L) 13.0 - 17.0 g/dL   HCT 33.4 (L) 39.0 - 52.0 %   MCV 82.9 78.0 - 100.0 fL   MCH 29.0 26.0 - 34.0 pg   MCHC 35.0 30.0 - 36.0 g/dL   RDW 13.3 11.5 - 15.5 %   Platelets 201 150 - 400 K/uL  CBC     Status:  Abnormal   Collection Time: 09/23/15  5:30 AM  Result Value Ref Range   WBC 5.6 4.0 - 10.5 K/uL   RBC 4.41 4.22 - 5.81 MIL/uL   Hemoglobin 12.3 (L) 13.0 - 17.0 g/dL   HCT 36.8 (L) 39.0 - 52.0 %   MCV 83.4 78.0 - 100.0 fL   MCH 27.9 26.0 - 34.0 pg   MCHC 33.4 30.0 - 36.0 g/dL   RDW 13.2 11.5 - 15.5 %   Platelets 209 150 - 400 K/uL  Basic metabolic panel     Status: Abnormal   Collection Time: 09/23/15  5:30 AM  Result Value Ref Range   Sodium 140 135 - 145 mmol/L   Potassium 4.2 3.5 - 5.1 mmol/L   Chloride 107 101 - 111 mmol/L   CO2 25 22 - 32 mmol/L   Glucose, Bld 87 65 - 99 mg/dL   BUN 6 6 - 20 mg/dL   Creatinine, Ser 0.92 0.61 - 1.24 mg/dL   Calcium 8.7 (L) 8.9 - 10.3 mg/dL   GFR calc non Af Amer >60 >60 mL/min   GFR calc Af Amer >60 >60 mL/min    Comment: (NOTE) The eGFR has been calculated using the CKD EPI equation. This calculation has not been validated in all clinical situations. eGFR's persistently <60 mL/min signify possible Chronic Kidney Disease.    Anion gap 8 5 - 15  CK     Status: Abnormal   Collection Time: 09/23/15   5:30 AM  Result Value Ref Range   Total CK 1871 (H) 49 - 397 U/L    Mr Cervical Spine Wo Contrast  09/22/2015  CLINICAL DATA:  Fever and diffuse neck pain, 2 days duration. EXAM: MRI CERVICAL SPINE WITHOUT CONTRAST TECHNIQUE: Multiplanar, multisequence MR imaging of the cervical spine was performed. No intravenous contrast was administered. COMPARISON:  None. FINDINGS: Alignment of the spine is normal. No degenerative change. Spinal canal and foramina are widely patent. No cord lesion. Abnormal fluid in the retropharyngeal/prevertebral space extending from C2 to C6 consistent with pharyngitis. Reactive cervical lymphadenopathy is noted including a suppurative node on the right between the vessels and the longus coli muscle measuring 3 cm in length with a transverse diameter of 14 mm, consistent with the retropharyngeal node of Rouviere. IMPRESSION: Suppurative retropharyngeal lymph node of Rouviere on the right with retropharyngeal phlegmonous inflammation/effusion. Electronically Signed   By: Nelson Chimes M.D.   On: 09/22/2015 14:44    Review of Systems  HENT: Positive for sore throat and tinnitus.   Musculoskeletal: Positive for neck pain.  All other systems reviewed and are negative.  Blood pressure 102/56, pulse 54, temperature 97.7 F (36.5 C), temperature source Oral, resp. rate 16, height _0  (1.778 m), weight 66.271 kg (146 lb 1.6 oz), SpO2 97 %. Physical Exam  Constitutional: He is oriented to person, place, and time. He appears well-developed and well-nourished. No distress.  HENT:  Head: Normocephalic and atraumatic.  Right Ear: External ear normal.  Left Ear: External ear normal.  Nose: Nose normal.  Mouth/Throat: Oropharynx is clear and moist.  Eyes: Conjunctivae and EOM are normal. Pupils are equal, round, and reactive to light.  Neck:  Limited ROM.  Right zone 2 tenderness.  Cardiovascular: Normal rate.   Respiratory: Effort normal.  Musculoskeletal: Normal range of  motion.  Neurological: He is alert and oriented to person, place, and time. No cranial nerve deficit.  Skin: Skin is warm and dry.  Psychiatric: He has a normal  mood and affect. His behavior is normal. Judgment and thought content normal.    Assessment/Plan: Cervical lymphadenitis and retropharyngeal cellulitis I personally reviewed the cervical MRI yesterday and reviewed it with the radiologist.  There does not appear to be a drainable fluid collection based on that imaging result.  I recommended treatment with clindamycin which was started last night.  Intravenous dosing may be a better route.  I recommend antibiotic therapy and pain relief for the time being but may consider reimaging with CT in a couple of days if symptoms are not improving.  Normal WBC and lack of fever noted.  William Cordova 09/23/2015, 8:28 AM

## 2015-09-23 NOTE — Progress Notes (Signed)
Explained pain medication to patient and mother. Patient sitting up in bed. Pain medication given per PRN and scheduled order. Will continue to monitor patient to end of shift

## 2015-09-23 NOTE — Progress Notes (Signed)
Utilization review complete. Britainy Kozub RN CCM Case Mgmt phone [phone] 

## 2015-09-24 ENCOUNTER — Inpatient Hospital Stay (HOSPITAL_COMMUNITY): Payer: BLUE CROSS/BLUE SHIELD

## 2015-09-24 NOTE — Progress Notes (Signed)
Subjective: Overall, he feels no worse, perhaps slightly better.  Able to swallow better.  Neck remains stiff with pain.  Objective: Vital signs in last 24 hours: Temp:  [97.7 F (36.5 C)-98.1 F (36.7 C)] 97.7 F (36.5 C) (01/31 0600) Pulse Rate:  [49-58] 54 (01/31 0600) Resp:  [15-18] 15 (01/31 0600) BP: (106-113)/(54-62) 112/62 mmHg (01/31 0600) SpO2:  [99 %-100 %] 100 % (01/31 0600) Weight:  [66.633 kg (146 lb 14.4 oz)] 66.633 kg (146 lb 14.4 oz) (01/31 0600) Last BM Date: 09/20/15  Intake/Output from previous day: 01/30 0701 - 01/31 0700 In: 3394.3 [P.O.:1020; I.V.:2224.3; IV Piggyback:150] Out: [id number] [Urine:3475] Intake/Output this shift:    General appearance: alert, cooperative and no distress Neck: limited ROM, right upper neck tender.  Lab Results:   Recent Labs  09/22/15 0345 09/23/15 0530  WBC 7.4 5.6  HGB 11.7* 12.3*  HCT 33.4* 36.8*  PLT 201 209   BMET  Recent Labs  09/22/15 0345 09/23/15 0530  NA 141 140  K 4.3 4.2  CL 104 107  CO2 26 25  GLUCOSE 96 87  BUN 6 6  CREATININE 1.05 0.92  CALCIUM 8.7* 8.7*   PT/INR No results for input(s): LABPROT, INR in the last 72 hours. ABG No results for input(s): PHART, HCO3 in the last 72 hours.  Invalid input(s): PCO2, PO2  Studies/Results: Mr Cervical Spine Wo Contrast  09/22/2015  CLINICAL DATA:  Fever and diffuse neck pain, 2 days duration. EXAM: MRI CERVICAL SPINE WITHOUT CONTRAST TECHNIQUE: Multiplanar, multisequence MR imaging of the cervical spine was performed. No intravenous contrast was administered. COMPARISON:  None. FINDINGS: Alignment of the spine is normal. No degenerative change. Spinal canal and foramina are widely patent. No cord lesion. Abnormal fluid in the retropharyngeal/prevertebral space extending from C2 to C6 consistent with pharyngitis. Reactive cervical lymphadenopathy is noted including a suppurative node on the right between the vessels and the longus coli muscle measuring  3 cm in length with a transverse diameter of 14 mm, consistent with the retropharyngeal node of Rouviere. IMPRESSION: Suppurative retropharyngeal lymph node of Rouviere on the right with retropharyngeal phlegmonous inflammation/effusion. Electronically Signed   By: Nelson Chimes M.D.   On: 09/22/2015 14:44    Anti-infectives: Anti-infectives    Start     Dose/Rate Route Frequency Ordered Stop   09/23/15 1200  clindamycin (CLEOCIN) IVPB 600 mg     600 mg 100 mL/hr over 30 Minutes Intravenous 3 times per day 09/23/15 1054     09/23/15 0000  clindamycin (CLEOCIN) capsule 600 mg  Status:  Discontinued     600 mg Oral 4 times per day 09/22/15 1609 09/23/15 1054   09/22/15 1800  clindamycin (CLEOCIN) capsule 300 mg  Status:  Discontinued     300 mg Oral 4 times per day 09/22/15 1538 09/22/15 1609   09/21/15 0800  doxycycline (VIBRAMYCIN) 100 mg in dextrose 5 % 250 mL IVPB  Status:  Discontinued     100 mg 125 mL/hr over 120 Minutes Intravenous Every 12 hours 09/21/15 0338 09/21/15 0434   09/21/15 0600  acyclovir (ZOVIRAX) 625 mg in dextrose 5 % 100 mL IVPB  Status:  Discontinued     10 mg/kg  62.7 kg 112.5 mL/hr over 60 Minutes Intravenous 3 times per day 09/21/15 0434 09/21/15 0838   09/21/15 0500  cefTRIAXone (ROCEPHIN) 2 g in dextrose 5 % 50 mL IVPB  Status:  Discontinued     2 g 100 mL/hr over 30 Minutes Intravenous  Every 12 hours 09/21/15 0356 09/21/15 0838   09/21/15 0500  vancomycin (VANCOCIN) IVPB 1000 mg/200 mL premix  Status:  Discontinued     1,000 mg 200 mL/hr over 60 Minutes Intravenous Every 8 hours 09/21/15 0356 09/21/15 [id number]      Assessment/Plan: Cervical lymphadenitis, retropharyngeal cellulitis Continue current management.  WBC and temperature remain normal.  Hopefully, we will see noticeable improvement over the next 24 hours.  If not, we may consider repeat CT tomorrow or the next day.  LOS: 3 days    William Cordova 09/24/2015

## 2015-09-24 NOTE — Progress Notes (Signed)
PROGRESS NOTE  William Cordova ID:[id number] DOB: May 28, 1996 DOA: 09/21/2015 PCP: Angelina Sheriff., MD  William Cordova is a 20 y.o. male with a past medical history significant for concussion who presents with 3 days headache and fever. The patient was in his usual state of health until about three days ago when he developed posterior neck pain. This progressed the next day to headache and low grade fever, and then today to severe neck ache, back ache, pain with swallowing, headache, and fever alternating with drenching sweats and chills. The pain was more severe than a broken bone, so he had his girlfriend drive him home from college at [employer] to go to the hospital.  LP done-- negative for menigits Patient has been very slow to recover-- sore throat, pain and muscle stiffness   Assessment/Plan: Headache, fever, sweats and body ache:  -HIV negative - ID- has seen and signed off- suspect viral -check orthopantogram- c/o tooth pain  Neck pain-severe in nature -no neurologic symptoms of numbness or weakness -CT scan done at [facility] -MRI as below  MRI shows pharyngitis with small phlegmon- Start clindmyacin as has been on amoxicillin and augmentin in last 30 days -appreciate ENT -no stridor or drooling to suggest Retropharyngeal abscess  LFTs:  Suspect due rhabdo -resolved  rhabdo- recently increased work out -IVF -CK decreasing  THC counseled on cessation  Constipation -encouraged ambulation -senna  Encouraged patient to ambulate   Code Status: full Family Communication: patient/mom Disposition Plan:    Consultants:  ID  ENT  Procedures:      HPI/Subjective: May been feeling slightly better Pain still 9/10 Patient also saying he has right upper tooth pain   Objective: Filed Vitals:   09/23/15 2146 09/24/15 0600  BP: 113/54 112/62  Pulse: 58 54  Temp: 97.8 F (36.6 C) 97.7 F (36.5 C)  Resp: 16 15    Intake/Output Summary (Last 24 hours)  at 09/24/15 1414 Last data filed at 09/24/15 0640  Gross per 24 hour  Intake 3394.25 ml  Output   2575 ml  Net 819.25 ml   Filed Weights   09/22/15 0459 09/23/15 0519 09/24/15 0600  Weight: 66.089 kg (145 lb 11.2 oz) 66.271 kg (146 lb 1.6 oz) 66.633 kg (146 lb 14.4 oz)    Exam:   General:  Awake, flat  Neck- tender to palpation  No edema LE  Data Reviewed: Basic Metabolic Panel:  Recent Labs Lab 09/21/15 0417 09/22/15 0345 09/23/15 0530  NA 138 141 140  K 4.9 4.3 4.2  CL 100* 104 107  CO2 27 26 25   GLUCOSE 160* 96 87  BUN 8 6 6   CREATININE 1.20 1.05 0.92  CALCIUM 9.6 8.7* 8.7*   Liver Function Tests:  Recent Labs Lab 09/21/15 0417 09/22/15 0345  AST 115* 69*  ALT 76* 54  ALKPHOS 56 42  BILITOT 1.0 0.4  PROT 6.9 5.1*  ALBUMIN 3.6 2.7*   No results for input(s): LIPASE, AMYLASE in the last 168 hours. No results for input(s): AMMONIA in the last 168 hours. CBC:  Recent Labs Lab 09/21/15 0417 09/22/15 0345 09/23/15 0530  WBC 11.2* 7.4 5.6  HGB 14.6 11.7* 12.3*  HCT 41.1 33.4* 36.8*  MCV 81.9 82.9 83.4  PLT 223 201 209   Cardiac Enzymes:  Recent Labs Lab 09/21/15 1245 09/22/15 0345 09/23/15 0530  CKTOTAL 3658* 2676* 1871*   BNP (last 3 results) No results for input(s): BNP in the last 8760 hours.  ProBNP (last 3 results)  No results for input(s): PROBNP in the last 8760 hours.  CBG: No results for input(s): GLUCAP in the last 168 hours.  No results found for this or any previous visit (from the past 240 hour(s)).   Studies: Mr Cervical Spine Wo Contrast  09/22/2015  CLINICAL DATA:  Fever and diffuse neck pain, 2 days duration. EXAM: MRI CERVICAL SPINE WITHOUT CONTRAST TECHNIQUE: Multiplanar, multisequence MR imaging of the cervical spine was performed. No intravenous contrast was administered. COMPARISON:  None. FINDINGS: Alignment of the spine is normal. No degenerative change. Spinal canal and foramina are widely patent. No cord  lesion. Abnormal fluid in the retropharyngeal/prevertebral space extending from C2 to C6 consistent with pharyngitis. Reactive cervical lymphadenopathy is noted including a suppurative node on the right between the vessels and the longus coli muscle measuring 3 cm in length with a transverse diameter of 14 mm, consistent with the retropharyngeal node of Rouviere. IMPRESSION: Suppurative retropharyngeal lymph node of Rouviere on the right with retropharyngeal phlegmonous inflammation/effusion. Electronically Signed   By: Nelson Chimes M.D.   On: 09/22/2015 14:44    Scheduled Meds: . clindamycin (CLEOCIN) IV  600 mg Intravenous 3 times per day  . enoxaparin (LOVENOX) injection  40 mg Subcutaneous Q24H  . ibuprofen  600 mg Oral TID  . Influenza vac split quadrivalent PF  0.5 mL Intramuscular Tomorrow-1000  . senna-docusate  1 tablet Oral BID  . sodium chloride flush  3 mL Intravenous Q12H   Continuous Infusions: . sodium chloride 75 mL/hr at 09/24/15 0045   Antibiotics Given (last 72 hours)    Date/Time Action Medication Dose Rate   09/22/15 1606 Given   clindamycin (CLEOCIN) capsule 300 mg 300 mg    09/22/15 2357 Given   clindamycin (CLEOCIN) capsule 600 mg 600 mg    09/23/15 0535 Given   clindamycin (CLEOCIN) capsule 600 mg 600 mg    09/23/15 1252 Given   clindamycin (CLEOCIN) IVPB 600 mg 600 mg 100 mL/hr   09/24/15 0045 Given   clindamycin (CLEOCIN) IVPB 600 mg 600 mg 100 mL/hr   09/24/15 [id number] Given   clindamycin (CLEOCIN) IVPB 600 mg 600 mg 100 mL/hr      Active Problems:   Neck pain   Sore throat (viral)   Rhabdomyolysis    Time spent: 15 min    [facility] Pager [phone] 7PM-7AM, please contact night-coverage at [email or web address], password [facility] 09/24/2015, 2:14 PM  LOS: 3 days

## 2015-09-25 ENCOUNTER — Inpatient Hospital Stay (HOSPITAL_COMMUNITY): Payer: BLUE CROSS/BLUE SHIELD

## 2015-09-25 ENCOUNTER — Encounter (HOSPITAL_COMMUNITY): Payer: Self-pay | Admitting: Radiology

## 2015-09-25 DIAGNOSIS — M542 Cervicalgia: Secondary | ICD-10-CM

## 2015-09-25 DIAGNOSIS — T796XXA Traumatic ischemia of muscle, initial encounter: Secondary | ICD-10-CM

## 2015-09-25 MED ORDER — IOHEXOL 300 MG/ML  SOLN
75.0000 mL | Freq: Once | INTRAMUSCULAR | Status: AC | PRN
  Administered 2015-09-25: 75 mL via INTRAVENOUS

## 2015-09-25 NOTE — Progress Notes (Signed)
  Subjective: He continues with neck pain, stiff neck, and some pain with swallowing.  He does not feel that he is improving.  Objective: Vital signs in last 24 hours: Temp:  [98.2 F (36.8 C)-98.7 F (37.1 C)] 98.2 F (36.8 C) (02/01 1220) Pulse Rate:  [54-61] 54 (02/01 1220) Resp:  [16] 16 (02/01 1220) BP: (113-123)/(60-97) 119/66 mmHg (02/01 1220) SpO2:  [100 %] 100 % (02/01 1220) Weight:  [67.314 kg (148 lb 6.4 oz)] 67.314 kg (148 lb 6.4 oz) (02/01 0615) Last BM Date: 09/20/15  Intake/Output from previous day: 01/31 0701 - 02/01 0700 In: 700 [P.O.:600; IV Piggyback:100] Out: 2040 [Urine:2040] Intake/Output this shift: Total I/O In: 120 [P.O.:120] Out: 600 [Urine:600]  General appearance: alert, cooperative and no distress Neck: limited ROM in all directions, tender right neck  Lab Results:   Recent Labs  09/23/15 0530  WBC 5.6  HGB 12.3*  HCT 36.8*  PLT 209   BMET  Recent Labs  09/23/15 0530  NA 140  K 4.2  CL 107  CO2 25  GLUCOSE 87  BUN 6  CREATININE 0.92  CALCIUM 8.7*   PT/INR No results for input(s): LABPROT, INR in the last 72 hours. ABG No results for input(s): PHART, HCO3 in the last 72 hours.  Invalid input(s): PCO2, PO2  Studies/Results: Dg Orthopantogram  09/24/2015  CLINICAL DATA:  Right-sided jaw pain 3 days.  No injury. EXAM: ORTHOPANTOGRAM/PANORAMIC COMPARISON:  None. FINDINGS: Mandible is within normal. No significant evidence of dental caries or periodontal disease. IMPRESSION: No acute findings. Electronically Signed   By: Marin Olp M.D.   On: 09/24/2015 15:08    Anti-infectives: Anti-infectives    Start     Dose/Rate Route Frequency Ordered Stop   09/23/15 1200  clindamycin (CLEOCIN) IVPB 600 mg     600 mg 100 mL/hr over 30 Minutes Intravenous 3 times per day 09/23/15 1054     09/23/15 0000  clindamycin (CLEOCIN) capsule 600 mg  Status:  Discontinued     600 mg Oral 4 times per day 09/22/15 1609 09/23/15 1054   09/22/15 1800  clindamycin (CLEOCIN) capsule 300 mg  Status:  Discontinued     300 mg Oral 4 times per day 09/22/15 1538 09/22/15 1609   09/21/15 0800  doxycycline (VIBRAMYCIN) 100 mg in dextrose 5 % 250 mL IVPB  Status:  Discontinued     100 mg 125 mL/hr over 120 Minutes Intravenous Every 12 hours 09/21/15 0338 09/21/15 0434   09/21/15 0600  acyclovir (ZOVIRAX) 625 mg in dextrose 5 % 100 mL IVPB  Status:  Discontinued     10 mg/kg  62.7 kg 112.5 mL/hr over 60 Minutes Intravenous 3 times per day 09/21/15 0434 09/21/15 0838   09/21/15 0500  cefTRIAXone (ROCEPHIN) 2 g in dextrose 5 % 50 mL IVPB  Status:  Discontinued     2 g 100 mL/hr over 30 Minutes Intravenous Every 12 hours 09/21/15 0356 09/21/15 0838   09/21/15 0500  vancomycin (VANCOCIN) IVPB 1000 mg/200 mL premix  Status:  Discontinued     1,000 mg 200 mL/hr over 60 Minutes Intravenous Every 8 hours 09/21/15 0356 09/21/15 [id number]      Assessment/Plan: Acute cervical lymphadenitis Without significant improvement in symptoms, we agreed to proceed with an updated imaging test, this time with CT.  I will review the result later today.  LOS: 4 days    Shakaya Bhullar 09/25/2015

## 2015-09-25 NOTE — Progress Notes (Signed)
PROGRESS NOTE  William Cordova ID:[id number] DOB: Jan 31, 1996 DOA: 09/21/2015 PCP: Angelina Sheriff., MD  Subjective: Seen with his mother at bedside, feels pain is slightly better. Appears to be able to eat without problems, handling his own secretions without drooling. Still reporting right-sided neck pain, severe.  HPI: William Cordova is a 20 y.o. male with a past medical history significant for concussion who presents with 3 days headache and fever. The patient was in his usual state of health until about three days ago when he developed posterior neck pain. This progressed the next day to headache and low grade fever, and then today to severe neck ache, back ache, pain with swallowing, headache, and fever alternating with drenching sweats and chills. The pain was more severe than a broken bone, so he had his girlfriend drive him home from college at [employer] to go to the hospital.  LP done-- negative for menigits Patient has been very slow to recover-- sore throat, pain and muscle stiffness   Assessment/Plan:  Retropharyngeal cellulitis -Patient presented with headache, fever, sweats and body aches, ID was consulted suspected viral infection. -Has negative HIV, CSF is negative for meningitis. -Patient is very slow to recover, still reporting right-sided neck pain. -Negative orthopantogram. -ENT reporting he has cervical lymphadenitis and retropharyngeal cellulitis, continue antibiotics for now. -Consider CT scan if no improvement. Will follow ENT recommendations.  Neck pain-severe in nature -no neurologic symptoms of numbness or weakness -CT scan done at [facility] -MRI as below  MRI shows pharyngitis with small phlegmon- -Start clindmyacin as has been on amoxicillin and augmentin in last 30 days -appreciate ENT -no stridor or drooling to suggest Retropharyngeal abscess  LFTs:  Suspect due rhabdo -resolved  rhabdo- recently increased work out -IVF -CK  decreasing  THC counseled on cessation  Constipation -encouraged ambulation -senna  Encouraged patient to ambulate   Code Status: full Family Communication: patient/mom Disposition Plan:    Consultants:  ID  ENT  Procedures:         Objective: Filed Vitals:   09/24/15 2005 09/25/15 0615  BP: 113/97 123/60  Pulse: 61 54  Temp: 98.7 F (37.1 C) 98.5 F (36.9 C)  Resp: 16 16    Intake/Output Summary (Last 24 hours) at 09/25/15 1021 Last data filed at 09/25/15 0906  Gross per 24 hour  Intake    820 ml  Output   2040 ml  Net  -1220 ml   Filed Weights   09/23/15 0519 09/24/15 0600 09/25/15 0615  Weight: 66.271 kg (146 lb 1.6 oz) 66.633 kg (146 lb 14.4 oz) 67.314 kg (148 lb 6.4 oz)    Exam:   General:  Awake, flat  Neck- tender to palpation  No edema LE  Data Reviewed: Basic Metabolic Panel:  Recent Labs Lab 09/21/15 0417 09/22/15 0345 09/23/15 0530  NA 138 141 140  K 4.9 4.3 4.2  CL 100* 104 107  CO2 27 26 25   GLUCOSE 160* 96 87  BUN 8 6 6   CREATININE 1.20 1.05 0.92  CALCIUM 9.6 8.7* 8.7*   Liver Function Tests:  Recent Labs Lab 09/21/15 0417 09/22/15 0345  AST 115* 69*  ALT 76* 54  ALKPHOS 56 42  BILITOT 1.0 0.4  PROT 6.9 5.1*  ALBUMIN 3.6 2.7*   No results for input(s): LIPASE, AMYLASE in the last 168 hours. No results for input(s): AMMONIA in the last 168 hours. CBC:  Recent Labs Lab 09/21/15 0417 09/22/15 0345 09/23/15 0530  WBC 11.2* 7.4 5.6  HGB 14.6 11.7* 12.3*  HCT 41.1 33.4* 36.8*  MCV 81.9 82.9 83.4  PLT 223 201 209   Cardiac Enzymes:  Recent Labs Lab 09/21/15 1245 09/22/15 0345 09/23/15 0530  CKTOTAL 3658* 2676* 1871*   BNP (last 3 results) No results for input(s): BNP in the last 8760 hours.  ProBNP (last 3 results) No results for input(s): PROBNP in the last 8760 hours.  CBG: No results for input(s): GLUCAP in the last 168 hours.  No results found for this or any previous visit (from  the past 240 hour(s)).   Studies: Dg Orthopantogram  09/24/2015  CLINICAL DATA:  Right-sided jaw pain 3 days.  No injury. EXAM: ORTHOPANTOGRAM/PANORAMIC COMPARISON:  None. FINDINGS: Mandible is within normal. No significant evidence of dental caries or periodontal disease. IMPRESSION: No acute findings. Electronically Signed   By: Marin Olp M.D.   On: 09/24/2015 15:08    Scheduled Meds: . clindamycin (CLEOCIN) IV  600 mg Intravenous 3 times per day  . enoxaparin (LOVENOX) injection  40 mg Subcutaneous Q24H  . ibuprofen  600 mg Oral TID  . Influenza vac split quadrivalent PF  0.5 mL Intramuscular Tomorrow-1000  . senna-docusate  1 tablet Oral BID  . sodium chloride flush  3 mL Intravenous Q12H   Continuous Infusions:   Antibiotics Given (last 72 hours)    Date/Time Action Medication Dose Rate   09/22/15 1606 Given   clindamycin (CLEOCIN) capsule 300 mg 300 mg    09/22/15 2357 Given   clindamycin (CLEOCIN) capsule 600 mg 600 mg    09/23/15 0535 Given   clindamycin (CLEOCIN) capsule 600 mg 600 mg    09/23/15 1252 Given   clindamycin (CLEOCIN) IVPB 600 mg 600 mg 100 mL/hr   09/24/15 0045 Given   clindamycin (CLEOCIN) IVPB 600 mg 600 mg 100 mL/hr   09/24/15 [id number] Given   clindamycin (CLEOCIN) IVPB 600 mg 600 mg 100 mL/hr   09/24/15 1438 Given   clindamycin (CLEOCIN) IVPB 600 mg 600 mg 100 mL/hr   09/24/15 2104 Given   clindamycin (CLEOCIN) IVPB 600 mg 600 mg 100 mL/hr   09/25/15 0641 Given   clindamycin (CLEOCIN) IVPB 600 mg 600 mg 100 mL/hr      Active Problems:   Neck pain   Sore throat (viral)   Rhabdomyolysis    Time spent: 15 min    [facility]  [facility] Pager [phone] 7PM-7AM, please contact night-coverage at [email or web address], password [facility] 09/25/2015, 10:21 AM  LOS: 4 days

## 2015-09-26 LAB — MALARIA SMEAR: SPECIAL REQUESTS: NORMAL

## 2015-09-26 MED ORDER — OXYCODONE-ACETAMINOPHEN 5-325 MG PO TABS
1.0000 | ORAL_TABLET | Freq: Four times a day (QID) | ORAL | Status: DC | PRN
  Administered 2015-09-26 – 2015-09-28 (×8): 2 via ORAL
  Filled 2015-09-26 (×8): qty 2

## 2015-09-26 MED ORDER — CLINDAMYCIN HCL 300 MG PO CAPS
300.0000 mg | ORAL_CAPSULE | Freq: Four times a day (QID) | ORAL | Status: DC
Start: 2015-09-26 — End: 2015-09-28
  Administered 2015-09-26 – 2015-09-28 (×9): 300 mg via ORAL
  Filled 2015-09-26 (×9): qty 1

## 2015-09-26 MED ORDER — PREDNISONE 10 MG PO TABS: 60.0000 mg | ORAL_TABLET | Freq: Every day | ORAL | Status: DC

## 2015-09-26 MED ORDER — METHOCARBAMOL 500 MG PO TABS
500.0000 mg | ORAL_TABLET | Freq: Three times a day (TID) | ORAL | Status: DC | PRN
  Administered 2015-09-26 – 2015-09-28 (×5): 500 mg via ORAL
  Filled 2015-09-26 (×6): qty 1

## 2015-09-26 MED ORDER — METHYLPREDNISOLONE SODIUM SUCC 125 MG IJ SOLR
125.0000 mg | Freq: Once | INTRAMUSCULAR | Status: AC
  Administered 2015-09-26: 125 mg via INTRAVENOUS
  Filled 2015-09-26: qty 2

## 2015-09-26 NOTE — Progress Notes (Signed)
Pt and pt's mother requesting robaxin.  MD notified, order placed.

## 2015-09-26 NOTE — Progress Notes (Signed)
Subjective: Continues with throbbing pain requiring prn pain medicine.  Objective: Vital signs in last 24 hours: Temp:  [97.3 F (36.3 C)-98.2 F (36.8 C)] 97.4 F (36.3 C) (02/02 0546) Pulse Rate:  [48-57] 48 (02/02 0546) Resp:  [16-18] 18 (02/02 0546) BP: (111-123)/(53-67) 111/53 mmHg (02/02 0546) SpO2:  [98 %-100 %] 98 % (02/02 0546) Weight:  [66.769 kg (147 lb 3.2 oz)] 66.769 kg (147 lb 3.2 oz) (02/02 0546) Last BM Date: 09/25/15  Intake/Output from previous day: 02/01 0701 - 02/02 0700 In: 770 [P.O.:720; IV Piggyback:50] Out: 2280 [Urine:2280] Intake/Output this shift:    General appearance: sleeping Neck: exam limited by sleep  Lab Results:  No results for input(s): WBC, HGB, HCT, PLT in the last 72 hours. BMET No results for input(s): NA, K, CL, CO2, GLUCOSE, BUN, CREATININE, CALCIUM in the last 72 hours. PT/INR No results for input(s): LABPROT, INR in the last 72 hours. ABG No results for input(s): PHART, HCO3 in the last 72 hours.  Invalid input(s): PCO2, PO2  Studies/Results: Dg Orthopantogram  09/24/2015  CLINICAL DATA:  Right-sided jaw pain 3 days.  No injury. EXAM: ORTHOPANTOGRAM/PANORAMIC COMPARISON:  None. FINDINGS: Mandible is within normal. No significant evidence of dental caries or periodontal disease. IMPRESSION: No acute findings. Electronically Signed   By: Marin Olp M.D.   On: 09/24/2015 15:08   Ct Soft Tissue Neck W Contrast  09/25/2015  CLINICAL DATA:  Neck pain and throat pain.  Difficulty swallowing. EXAM: CT NECK WITH CONTRAST TECHNIQUE: Multidetector CT imaging of the neck was performed using the standard protocol following the bolus administration of intravenous contrast. CONTRAST:  37mL OMNIPAQUE IOHEXOL 300 MG/ML  SOLN COMPARISON:  None. FINDINGS: Pharynx and larynx: There is moderate prominence the palatine tonsils, the lingual tonsils, adenoid tissue. The soft palate contacts the posterior nasopharynx. The airway is patent. The  parapharyngeal fat is clear. No discrete mucosal or submucosal lesions are present. The vocal cords are midline symmetric. Salivary glands: The submandibular and parotid glands are within normal limits bilaterally. Thyroid: Negative Lymph nodes: Multiple bilateral submandibular lymph nodes are present. A posterior right level 2 lymph node measures 17 x 11 mm on the sagittal reformats. A posterior left level 2 lymph node measures 8 x 18 mm. There is mild prominence of jugulodigastric lymph node bilaterally. Vascular: Within normal limits. The right sigmoid sinus and internal jugular vein is much smaller than on the left. Prominent external jugular veins are present bilaterally. Limited intracranial: Negative Visualized orbits: Within normal limits. Mastoids and visualized paranasal sinuses: Clear Skeleton: Negative Upper chest: Clear IMPRESSION: 1. Prominence of lymphoid tissue in Waldeyer's ring and bilateral reactive lymphadenopathy compatible with typical upper respiratory infection. 2. No discrete abscess or mass lesion. Electronically Signed   By: San Morelle M.D.   On: 09/25/2015 17:04    Anti-infectives: Anti-infectives    Start     Dose/Rate Route Frequency Ordered Stop   09/23/15 1200  clindamycin (CLEOCIN) IVPB 600 mg     600 mg 100 mL/hr over 30 Minutes Intravenous 3 times per day 09/23/15 1054     09/23/15 0000  clindamycin (CLEOCIN) capsule 600 mg  Status:  Discontinued     600 mg Oral 4 times per day 09/22/15 1609 09/23/15 1054   09/22/15 1800  clindamycin (CLEOCIN) capsule 300 mg  Status:  Discontinued     300 mg Oral 4 times per day 09/22/15 1538 09/22/15 1609   09/21/15 0800  doxycycline (VIBRAMYCIN) 100 mg in dextrose 5 %  250 mL IVPB  Status:  Discontinued     100 mg 125 mL/hr over 120 Minutes Intravenous Every 12 hours 09/21/15 0338 09/21/15 0434   09/21/15 0600  acyclovir (ZOVIRAX) 625 mg in dextrose 5 % 100 mL IVPB  Status:  Discontinued     10 mg/kg  62.7 kg 112.5  mL/hr over 60 Minutes Intravenous 3 times per day 09/21/15 0434 09/21/15 0838   09/21/15 0500  cefTRIAXone (ROCEPHIN) 2 g in dextrose 5 % 50 mL IVPB  Status:  Discontinued     2 g 100 mL/hr over 30 Minutes Intravenous Every 12 hours 09/21/15 0356 09/21/15 0838   09/21/15 0500  vancomycin (VANCOCIN) IVPB 1000 mg/200 mL premix  Status:  Discontinued     1,000 mg 200 mL/hr over 60 Minutes Intravenous Every 8 hours 09/21/15 0356 09/21/15 0838      Assessment/Plan: Cervical lymphadenitis   I personally reviewed the neck CT last night and there is still no fluid collection that could be drained.  In fact, the enlarged lymph nodes look rather unimpressive.  Still, enlarged nodes is the only significant finding on CT.  Continue antibiotic therapy.  I would suggest adding a course of corticosteroid therapy, either with an oral taper or with a few IV doses.  Call with questions.  LOS: 5 days    William Cordova 09/26/2015

## 2015-09-26 NOTE — Progress Notes (Signed)
PROGRESS NOTE  William Cordova ID:[id number] DOB: 03-14-1996 DOA: 09/21/2015 PCP: Lin Landsman Angelique Blonder., MD  Subjective: Seen with his mother and father at bedside. Pain is better, denies swallowing problems.   HPI: William Cordova is a 20 y.o. male with a past medical history significant for concussion who presents with 3 days headache and fever. The patient was in his usual state of health until about three days ago when he developed posterior neck pain. This progressed the next day to headache and low grade fever, and then today to severe neck ache, back ache, pain with swallowing, headache, and fever alternating with drenching sweats and chills. The pain was more severe than a broken bone, so he had his girlfriend drive him home from college at [employer] -  to go to the hospital.  LP done-- negative for menigits Patient has been very slow to recover-- sore throat, pain and muscle stiffness   Assessment/Plan:  Retropharyngeal cellulitis -Patient presented with headache, fever, sweats and body aches, ID was consulted suspected viral infection. -Has negative HIV, CSF is negative for meningitis. -Patient is very slow to recover, still reporting right-sided neck pain. -Negative orthopantogram. -ENT reporting he has cervical lymphadenitis and retropharyngeal cellulitis, continue antibiotics for now. -Repeat CT is showing no fluids collection, lymphadenpathy compatible with upper respiratory tract infection. -Had a prolonged d/w pt in presence of his mother and father, will d/c in AM on oral steroid, Cleocin and   Neck pain-severe in nature -no neurologic symptoms of numbness or weakness -CT scan done at [facility] -MRI as below  MRI shows pharyngitis with small phlegmon- -Start clindmyacin as has been on amoxicillin and augmentin in last 30 days -appreciate ENT -no stridor or drooling to suggest Retropharyngeal abscess  LFTs:  Suspect due rhabdo -resolved  rhabdo- recently increased work  out -IVF -CK decreasing  THC counseled on cessation  Constipation -encouraged ambulation -senna  Encouraged patient to ambulate   Code Status: full Family Communication: patient/mom Disposition Plan:    Consultants:  ID  ENT  Procedures:         Objective: Filed Vitals:   09/26/15 0546 09/26/15 1208  BP: 111/53 104/57  Pulse: 48 49  Temp: 97.4 F (36.3 C)   Resp: 18 18    Intake/Output Summary (Last 24 hours) at 09/26/15 1523 Last data filed at 09/26/15 1227  Gross per 24 hour  Intake    890 ml  Output   2280 ml  Net  -1390 ml   Filed Weights   09/24/15 0600 09/25/15 0615 09/26/15 0546  Weight: 66.633 kg (146 lb 14.4 oz) 67.314 kg (148 lb 6.4 oz) 66.769 kg (147 lb 3.2 oz)    Exam:   General:  Awake, flat  Neck- tender to palpation  No edema LE  Data Reviewed: Basic Metabolic Panel:  Recent Labs Lab 09/21/15 0417 09/22/15 0345 09/23/15 0530  NA 138 141 140  K 4.9 4.3 4.2  CL 100* 104 107  CO2 27 26 25   GLUCOSE 160* 96 87  BUN 8 6 6   CREATININE 1.20 1.05 0.92  CALCIUM 9.6 8.7* 8.7*   Liver Function Tests:  Recent Labs Lab 09/21/15 0417 09/22/15 0345  AST 115* 69*  ALT 76* 54  ALKPHOS 56 42  BILITOT 1.0 0.4  PROT 6.9 5.1*  ALBUMIN 3.6 2.7*   No results for input(s): LIPASE, AMYLASE in the last 168 hours. No results for input(s): AMMONIA in the last 168 hours. CBC:  Recent Labs Lab 09/21/15 0417 09/22/15 0345  09/23/15 0530  WBC 11.2* 7.4 5.6  HGB 14.6 11.7* 12.3*  HCT 41.1 33.4* 36.8*  MCV 81.9 82.9 83.4  PLT 223 201 209   Cardiac Enzymes:  Recent Labs Lab 09/21/15 1245 09/22/15 0345 09/23/15 0530  CKTOTAL 3658* 2676* 1871*   BNP (last 3 results) No results for input(s): BNP in the last 8760 hours.  ProBNP (last 3 results) No results for input(s): PROBNP in the last 8760 hours.  CBG: No results for input(s): GLUCAP in the last 168 hours.  Recent Results (from the past 240 hour(s))  Malaria  smear     Status: None   Collection Time: 09/21/15  4:17 AM  Result Value Ref Range Status   Specimen Description BLOOD  Final   Special Requests Normal  Final   Malaria Prep SEE SEPARATE REPORT  Final   Report Status 09/26/2015 FINAL  Final     Studies: Ct Soft Tissue Neck W Contrast  09/25/2015  CLINICAL DATA:  Neck pain and throat pain.  Difficulty swallowing. EXAM: CT NECK WITH CONTRAST TECHNIQUE: Multidetector CT imaging of the neck was performed using the standard protocol following the bolus administration of intravenous contrast. CONTRAST:  45mL OMNIPAQUE IOHEXOL 300 MG/ML  SOLN COMPARISON:  None. FINDINGS: Pharynx and larynx: There is moderate prominence the palatine tonsils, the lingual tonsils, adenoid tissue. The soft palate contacts the posterior nasopharynx. The airway is patent. The parapharyngeal fat is clear. No discrete mucosal or submucosal lesions are present. The vocal cords are midline symmetric. Salivary glands: The submandibular and parotid glands are within normal limits bilaterally. Thyroid: Negative Lymph nodes: Multiple bilateral submandibular lymph nodes are present. A posterior right level 2 lymph node measures 17 x 11 mm on the sagittal reformats. A posterior left level 2 lymph node measures 8 x 18 mm. There is mild prominence of jugulodigastric lymph node bilaterally. Vascular: Within normal limits. The right sigmoid sinus and internal jugular vein is much smaller than on the left. Prominent external jugular veins are present bilaterally. Limited intracranial: Negative Visualized orbits: Within normal limits. Mastoids and visualized paranasal sinuses: Clear Skeleton: Negative Upper chest: Clear IMPRESSION: 1. Prominence of lymphoid tissue in Waldeyer's ring and bilateral reactive lymphadenopathy compatible with typical upper respiratory infection. 2. No discrete abscess or mass lesion. Electronically Signed   By: San Morelle M.D.   On: 09/25/2015 17:04     Scheduled Meds: . clindamycin  300 mg Oral 4 times per day  . enoxaparin (LOVENOX) injection  40 mg Subcutaneous Q24H  . Influenza vac split quadrivalent PF  0.5 mL Intramuscular Tomorrow-1000  . [START ON 09/27/2015] predniSONE  60 mg Oral Q breakfast  . senna-docusate  1 tablet Oral BID  . sodium chloride flush  3 mL Intravenous Q12H   Continuous Infusions:   Antibiotics Given (last 72 hours)    Date/Time Action Medication Dose Rate   09/24/15 0045 Given   clindamycin (CLEOCIN) IVPB 600 mg 600 mg 100 mL/hr   09/24/15 0635 Given   clindamycin (CLEOCIN) IVPB 600 mg 600 mg 100 mL/hr   09/24/15 1438 Given   clindamycin (CLEOCIN) IVPB 600 mg 600 mg 100 mL/hr   09/24/15 2104 Given   clindamycin (CLEOCIN) IVPB 600 mg 600 mg 100 mL/hr   09/25/15 0641 Given   clindamycin (CLEOCIN) IVPB 600 mg 600 mg 100 mL/hr   09/25/15 1448 Given   clindamycin (CLEOCIN) IVPB 600 mg 600 mg 100 mL/hr   09/25/15 2159 Given   clindamycin (CLEOCIN) IVPB 600 mg  600 mg 100 mL/hr   09/26/15 0647 Given   clindamycin (CLEOCIN) IVPB 600 mg 600 mg 100 mL/hr   09/26/15 1201 Given   clindamycin (CLEOCIN) capsule 300 mg 300 mg       Active Problems:   Neck pain   Sore throat (viral)   Rhabdomyolysis    Time spent: 20 min    [facility]  [facility] Pager [phone] if 7PM-7AM, please contact night-coverage at [email or web address], password [facility] 09/26/2015, 3:23 PM  LOS: 5 days

## 2015-09-27 DIAGNOSIS — B349 Viral infection, unspecified: Secondary | ICD-10-CM | POA: Insufficient documentation

## 2015-09-27 DIAGNOSIS — L049 Acute lymphadenitis, unspecified: Secondary | ICD-10-CM | POA: Insufficient documentation

## 2015-09-27 LAB — COMPREHENSIVE METABOLIC PANEL
ALBUMIN: 4.3 g/dL (ref 3.5–5.0)
ALK PHOS: 66 U/L (ref 38–126)
ALT: 151 U/L — ABNORMAL HIGH (ref 17–63)
AST: 146 U/L — AB (ref 15–41)
Anion gap: 12 (ref 5–15)
BILIRUBIN TOTAL: 0.7 mg/dL (ref 0.3–1.2)
BUN: 16 mg/dL (ref 6–20)
CO2: 26 mmol/L (ref 22–32)
Calcium: 10.3 mg/dL (ref 8.9–10.3)
Chloride: 100 mmol/L — ABNORMAL LOW (ref 101–111)
Creatinine, Ser: 1.13 mg/dL (ref 0.61–1.24)
GFR calc Af Amer: >60 mL/min (ref >60)
GFR calc non Af Amer: >60 mL/min (ref >60)
GLUCOSE: 126 mg/dL — AB (ref 65–99)
POTASSIUM: 4.8 mmol/L (ref 3.5–5.1)
Sodium: 138 mmol/L (ref 135–145)
TOTAL PROTEIN: 7.7 g/dL (ref 6.5–8.1)

## 2015-09-27 LAB — CK: Total CK: 91 U/L (ref 49–397)

## 2015-09-27 LAB — CBC WITH DIFFERENTIAL/PLATELET
BASOS ABS: 0 10*3/uL (ref 0.0–0.1)
BASOS PCT: 0 %
Eosinophils Absolute: 0 10*3/uL (ref 0.0–0.7)
Eosinophils Relative: 0 %
HEMATOCRIT: 43.3 % (ref 39.0–52.0)
HEMOGLOBIN: 15.6 g/dL (ref 13.0–17.0)
Lymphocytes Relative: 7 %
Lymphs Abs: 1 10*3/uL (ref 0.7–4.0)
MCH: 29.2 pg (ref 26.0–34.0)
MCHC: 36 g/dL (ref 30.0–36.0)
MCV: 80.9 fL (ref 78.0–100.0)
Monocytes Absolute: 0.2 10*3/uL (ref 0.1–1.0)
Monocytes Relative: 2 %
NEUTROS ABS: 13.8 10*3/uL — AB (ref 1.7–7.7)
NEUTROS PCT: 91 %
Platelets: 415 10*3/uL — ABNORMAL HIGH (ref 150–400)
RBC: 5.35 MIL/uL (ref 4.22–5.81)
RDW: 13 % (ref 11.5–15.5)
WBC: 15 10*3/uL — AB (ref 4.0–10.5)

## 2015-09-27 MED ORDER — DIPHENHYDRAMINE HCL 25 MG PO CAPS
25.0000 mg | ORAL_CAPSULE | Freq: Once | ORAL | Status: AC
  Administered 2015-09-27: 25 mg via ORAL
  Filled 2015-09-27: qty 1

## 2015-09-27 MED ORDER — PREDNISONE 50 MG PO TABS
60.0000 mg | ORAL_TABLET | Freq: Every day | ORAL | Status: DC
  Administered 2015-09-27 – 2015-09-28 (×2): 60 mg via ORAL
  Filled 2015-09-27 (×2): qty 1

## 2015-09-27 NOTE — Progress Notes (Signed)
In the bed watching computer program  No apparent distress.

## 2015-09-27 NOTE — Progress Notes (Addendum)
[facility] for Infectious Disease    Date of Admission:  09/21/2015   Total days of antibiotics 7        Day 6 clindamycin          ID: William Cordova is a 20 y.o. male with  febrile illness with myalgias, headache diffusely, poor po, mild transaminitis, abdominal discomfort without diarrhea, sore throat. DDx is broad but largely viral etiology. Not c/w typhoid fever, possible EBV/CMV, ILI. Dengue possible but less likely with no overt joint complaints or periorbital headache, chikungunya not likely without joint swelling. Meningitis r/o with no WBC on CSF, not c/w encephalitis with no confusion, normal glucose and protein.  Active Problems:   Neck pain   Sore throat (viral)   Rhabdomyolysis    Subjective: Still having poor sleep, myalgias, neck pain.  Interval hx: repeat CT shows improvement with retropharyngeal swelling and fluid collection. Now still has lymphadenopathy  Medications:  . clindamycin  300 mg Oral 4 times per day  . enoxaparin (LOVENOX) injection  40 mg Subcutaneous Q24H  . Influenza vac split quadrivalent PF  0.5 mL Intramuscular Tomorrow-1000  . predniSONE  60 mg Oral Q breakfast  . senna-docusate  1 tablet Oral BID  . sodium chloride flush  3 mL Intravenous Q12H    Objective: Vital signs in last 24 hours: Temp:  [98 F (36.7 C)-98.4 F (36.9 C)] 98.4 F (36.9 C) (02/03 0628) Pulse Rate:  [57-71] 57 (02/03 0628) Resp:  [16-17] 17 (02/03 0628) BP: (99-135)/(64-76) 99/64 mmHg (02/03 0628) SpO2:  [97 %-99 %] 97 % (02/03 0628) Weight:  [142 lb 6.4 oz (64.592 kg)] 142 lb 6.4 oz (64.592 kg) (02/03 [id number]) Physical Exam  Constitutional: He is oriented to person, place, and time. He appears well-developed and well-nourished. No distress.  HENT:  Mouth/Throat: Oropharynx is clear and moist. No oropharyngeal exudate.  Cardiovascular: Normal rate, regular rhythm and normal heart sounds. Exam reveals no gallop and no friction rub.  No murmur heard.    Pulmonary/Chest: Effort normal and breath sounds normal. No respiratory distress. He has no wheezes.  Abdominal: Soft. Bowel sounds are normal. He exhibits no distension. There is no tenderness.  Lymphadenopathy:  He has no cervical adenopathy.  Neurological: He is alert and oriented to person, place, and time.  Skin: Skin is warm and dry. No rash noted. No erythema.  Psychiatric: He has a normal mood and affect. His behavior is normal.     Lab Results Lab Results  Component Value Date   WBC 5.6 09/23/2015   HGB 12.3* 09/23/2015   HCT 36.8* 09/23/2015   MCV 83.4 09/23/2015   PLT 209 09/23/2015   BMET    Component Value Date/Time   NA 140 09/23/2015 0530   K 4.2 09/23/2015 0530   CL 107 09/23/2015 0530   CO2 25 09/23/2015 0530   GLUCOSE 87 09/23/2015 0530   BUN 6 09/23/2015 0530   CREATININE 0.92 09/23/2015 0530   CALCIUM 8.7* 09/23/2015 0530   GFRNONAA >60 09/23/2015 0530   GFRAA >60 09/23/2015 0530     Microbiology: cmv igm negative ebv acute negative Studies/Results: Ct Soft Tissue Neck W Contrast  09/25/2015  CLINICAL DATA:  Neck pain and throat pain.  Difficulty swallowing. EXAM: CT NECK WITH CONTRAST TECHNIQUE: Multidetector CT imaging of the neck was performed using the standard protocol following the bolus administration of intravenous contrast. CONTRAST:  3mL OMNIPAQUE IOHEXOL 300 MG/ML  SOLN COMPARISON:  None. FINDINGS: Pharynx and larynx:  There is moderate prominence the palatine tonsils, the lingual tonsils, adenoid tissue. The soft palate contacts the posterior nasopharynx. The airway is patent. The parapharyngeal fat is clear. No discrete mucosal or submucosal lesions are present. The vocal cords are midline symmetric. Salivary glands: The submandibular and parotid glands are within normal limits bilaterally. Thyroid: Negative Lymph nodes: Multiple bilateral submandibular lymph nodes are present. A posterior right level 2 lymph node measures 17 x 11 mm on the  sagittal reformats. A posterior left level 2 lymph node measures 8 x 18 mm. There is mild prominence of jugulodigastric lymph node bilaterally. Vascular: Within normal limits. The right sigmoid sinus and internal jugular vein is much smaller than on the left. Prominent external jugular veins are present bilaterally. Limited intracranial: Negative Visualized orbits: Within normal limits. Mastoids and visualized paranasal sinuses: Clear Skeleton: Negative Upper chest: Clear IMPRESSION: 1. Prominence of lymphoid tissue in Waldeyer's ring and bilateral reactive lymphadenopathy compatible with typical upper respiratory infection. 2. No discrete abscess or mass lesion. Electronically Signed   By: San Morelle M.D.   On: 09/25/2015 17:04     Assessment/Plan: Febrile illness in returned traveler from [place] (on June 5th) = likely having protracted viral illness. He apperas that he is improving slowly. Family is very concerned that he is not improving as quickly as they thought and that we have not have definitive diagnosis.his symptoms started 20 days after returning from cruise from [place]. Most concerninng for throat pain/headache, myalgias. Afebrile. Leukocytosis improved.  - cmv, ebv, hsv ruled out.malaria negative. LP not c/w bacterial infection. Still feel that this is a viral picture. He does not completely fit zika virus picture or timeframe of presentation of disease. Appears slowly improving. Neck ct is reassuring  - will recommend to continue with supportive care - will check dengue and chickungunya serology.  - will check cbc and cmp to ensure that is still remains improved from presentation - for rhabdo - will check ck to ensure also continues to trend down - will have pt follow up in 4 wk in ID clinic. Will sign off.  Baxter Flattery [facility] for Infectious Diseases Cell: [phone] Pager: [phone]  09/27/2015, 1:24 PM

## 2015-09-27 NOTE — Progress Notes (Signed)
Pt. C/o Rash to R upper arm (anterior). On call NP, Fredirick Maudlin, made aware. New orders received and implemented. RN will continue to monitor pt. For changes in condition. Tate Zagal, Katherine Roan

## 2015-09-27 NOTE — Progress Notes (Signed)
PROGRESS NOTE  [name] ID:[id number] DOB: 12-02-1995 DOA: 09/21/2015 PCP: Lin Landsman Angelique Blonder., MD  Subjective: Seen with his mother and girlfriend at bedside. Still complaining about generalized body aches, right neck pain, reporting it's not improving. Reported nausea after switching antibiotics to oral.  HPI: William Cordova is a 20 y.o. male with a past medical history significant for concussion who presents with 3 days headache and fever. The patient was in his usual state of health until about three days ago when he developed posterior neck pain. This progressed the next day to headache and low grade fever, and then today to severe neck ache, back ache, pain with swallowing, headache, and fever alternating with drenching sweats and chills. The pain was more severe than a broken bone, so he had his girlfriend drive him home from college at [employer] to go to the hospital.  LP done-- negative for menigits Patient has been very slow to recover-- sore throat, pain and muscle stiffness   Assessment/Plan:  Retropharyngeal cellulitis -Patient presented with headache, fever, sweats and body aches, ID was consulted suspected viral infection. -Has negative HIV, CSF is negative for meningitis. -Patient is very slow to recover, still reporting right-sided neck pain. -Negative orthopantogram. -ENT reporting he has cervical lymphadenitis and retropharyngeal cellulitis, continue antibiotics for now. -Repeat CT is showing no fluids collection, lymphadenpathy compatible with upper respiratory tract infection. -Still has some generalized symptoms like body aches and right neck pain, I will ask ID again to evaluate him. -His girlfriend reported that he was in a bats cave in the [place] in the beginning of January.  Neck pain-severe in nature -no neurologic symptoms of numbness or weakness -CT scan done at [facility] -MRI as below  MRI shows pharyngitis with small phlegmon- -Start clindmyacin as  has been on amoxicillin and augmentin in last 30 days -appreciate ENT -no stridor or drooling to suggest Retropharyngeal abscess, repeat CT scan showed result evaluation of fluids.  LFTs:  Suspect due rhabdo -resolved  rhabdo- recently increased work out -IVF -CK decreasing  [place] counseled on cessation  Constipation -encouraged ambulation -senna  Encouraged patient to ambulate   Code Status: full Family Communication: patient/mom Disposition Plan:    Consultants:  ID  ENT  Procedures:         Objective: Filed Vitals:   09/26/15 2028 09/27/15 0628  BP: 135/76 99/64  Pulse: 71 57  Temp: 98 F (36.7 C) 98.4 F (36.9 C)  Resp: 16 17    Intake/Output Summary (Last 24 hours) at 09/27/15 1033 Last data filed at 09/27/15 [id number]  Gross per 24 hour  Intake   1200 ml  Output   3225 ml  Net  -2025 ml   Filed Weights   09/25/15 0615 09/26/15 0546 09/27/15 0628  Weight: 67.314 kg (148 lb 6.4 oz) 66.769 kg (147 lb 3.2 oz) 64.592 kg (142 lb 6.4 oz)    Exam:   General:  Awake, flat  Neck- tender to palpation  No edema LE  Data Reviewed: Basic Metabolic Panel:  Recent Labs Lab 09/21/15 0417 09/22/15 0345 09/23/15 0530  NA 138 141 140  K 4.9 4.3 4.2  CL 100* 104 107  CO2 27 26 25   GLUCOSE 160* 96 87  BUN 8 6 6   CREATININE 1.20 1.05 0.92  CALCIUM 9.6 8.7* 8.7*   Liver Function Tests:  Recent Labs Lab 09/21/15 0417 09/22/15 0345  AST 115* 69*  ALT 76* 54  ALKPHOS 56 42  BILITOT 1.0 0.4  PROT 6.9 5.1*  ALBUMIN 3.6 2.7*   No results for input(s): LIPASE, AMYLASE in the last 168 hours. No results for input(s): AMMONIA in the last 168 hours. CBC:  Recent Labs Lab 09/21/15 0417 09/22/15 0345 09/23/15 0530  WBC 11.2* 7.4 5.6  HGB 14.6 11.7* 12.3*  HCT 41.1 33.4* 36.8*  MCV 81.9 82.9 83.4  PLT 223 201 209   Cardiac Enzymes:  Recent Labs Lab 09/21/15 1245 09/22/15 0345 09/23/15 0530  CKTOTAL 3658* 2676* 1871*   BNP (last  3 results) No results for input(s): BNP in the last 8760 hours.  ProBNP (last 3 results) No results for input(s): PROBNP in the last 8760 hours.  CBG: No results for input(s): GLUCAP in the last 168 hours.  Recent Results (from the past 240 hour(s))  Malaria smear     Status: None   Collection Time: 09/21/15  4:17 AM  Result Value Ref Range Status   Specimen Description BLOOD  Final   Special Requests Normal  Final   Malaria Prep SEE SEPARATE REPORT  Final   Report Status 09/26/2015 FINAL  Final     Studies: Ct Soft Tissue Neck W Contrast  09/25/2015  CLINICAL DATA:  Neck pain and throat pain.  Difficulty swallowing. EXAM: CT NECK WITH CONTRAST TECHNIQUE: Multidetector CT imaging of the neck was performed using the standard protocol following the bolus administration of intravenous contrast. CONTRAST:  58mL OMNIPAQUE IOHEXOL 300 MG/ML  SOLN COMPARISON:  None. FINDINGS: Pharynx and larynx: There is moderate prominence the palatine tonsils, the lingual tonsils, adenoid tissue. The soft palate contacts the posterior nasopharynx. The airway is patent. The parapharyngeal fat is clear. No discrete mucosal or submucosal lesions are present. The vocal cords are midline symmetric. Salivary glands: The submandibular and parotid glands are within normal limits bilaterally. Thyroid: Negative Lymph nodes: Multiple bilateral submandibular lymph nodes are present. A posterior right level 2 lymph node measures 17 x 11 mm on the sagittal reformats. A posterior left level 2 lymph node measures 8 x 18 mm. There is mild prominence of jugulodigastric lymph node bilaterally. Vascular: Within normal limits. The right sigmoid sinus and internal jugular vein is much smaller than on the left. Prominent external jugular veins are present bilaterally. Limited intracranial: Negative Visualized orbits: Within normal limits. Mastoids and visualized paranasal sinuses: Clear Skeleton: Negative Upper chest: Clear IMPRESSION: 1.  Prominence of lymphoid tissue in Waldeyer's ring and bilateral reactive lymphadenopathy compatible with typical upper respiratory infection. 2. No discrete abscess or mass lesion. Electronically Signed   By: San Morelle M.D.   On: 09/25/2015 17:04    Scheduled Meds: . clindamycin  300 mg Oral 4 times per day  . enoxaparin (LOVENOX) injection  40 mg Subcutaneous Q24H  . Influenza vac split quadrivalent PF  0.5 mL Intramuscular Tomorrow-1000  . predniSONE  60 mg Oral Q breakfast  . senna-docusate  1 tablet Oral BID  . sodium chloride flush  3 mL Intravenous Q12H   Continuous Infusions:   Antibiotics Given (last 72 hours)    Date/Time Action Medication Dose Rate   09/24/15 1438 Given   clindamycin (CLEOCIN) IVPB 600 mg 600 mg 100 mL/hr   09/24/15 2104 Given   clindamycin (CLEOCIN) IVPB 600 mg 600 mg 100 mL/hr   09/25/15 0641 Given   clindamycin (CLEOCIN) IVPB 600 mg 600 mg 100 mL/hr   09/25/15 1448 Given   clindamycin (CLEOCIN) IVPB 600 mg 600 mg 100 mL/hr   09/25/15 2159 Given   clindamycin (  CLEOCIN) IVPB 600 mg 600 mg 100 mL/hr   09/26/15 0647 Given   clindamycin (CLEOCIN) IVPB 600 mg 600 mg 100 mL/hr   09/26/15 1201 Given   clindamycin (CLEOCIN) capsule 300 mg 300 mg    09/26/15 1717 Given   clindamycin (CLEOCIN) capsule 300 mg 300 mg    09/27/15 0005 Given   clindamycin (CLEOCIN) capsule 300 mg 300 mg    09/27/15 0618 Given   clindamycin (CLEOCIN) capsule 300 mg 300 mg       Active Problems:   Neck pain   Sore throat (viral)   Rhabdomyolysis    Time spent: 20 min    [facility]  [facility] Pager [phone] if 7PM-7AM, please contact night-coverage at [email or web address], password [facility] 09/27/2015, 10:33 AM  LOS: 6 days

## 2015-09-28 DIAGNOSIS — B349 Viral infection, unspecified: Secondary | ICD-10-CM

## 2015-09-28 DIAGNOSIS — M6282 Rhabdomyolysis: Secondary | ICD-10-CM

## 2015-09-28 DIAGNOSIS — L049 Acute lymphadenitis, unspecified: Secondary | ICD-10-CM

## 2015-09-28 MED ORDER — HYDROCODONE-ACETAMINOPHEN 5-325 MG PO TABS: 1.0000 | ORAL_TABLET | Freq: Four times a day (QID) | ORAL | Status: AC | PRN

## 2015-09-28 MED ORDER — DIPHENHYDRAMINE HCL 25 MG PO CAPS
25.0000 mg | ORAL_CAPSULE | Freq: Once | ORAL | Status: AC
  Administered 2015-09-28: 25 mg via ORAL
  Filled 2015-09-28: qty 1

## 2015-09-28 MED ORDER — PREDNISONE 10 MG PO TABS: ORAL_TABLET | ORAL | Status: AC

## 2015-09-28 MED ORDER — CLINDAMYCIN HCL 300 MG PO CAPS: 300.0000 mg | ORAL_CAPSULE | Freq: Three times a day (TID) | ORAL | Status: AC

## 2015-09-28 MED ORDER — METHOCARBAMOL 500 MG PO TABS: 500.0000 mg | ORAL_TABLET | Freq: Three times a day (TID) | ORAL | Status: AC | PRN

## 2015-09-28 NOTE — Discharge Summary (Signed)
Physician Discharge Summary  Savvas Zoll ID:[id number] DOB: May 30, 1996 DOA: 09/21/2015  PCP: Angelina Sheriff., MD  Admit date: 09/21/2015 Discharge date: 09/28/2015  Time spent: 40 minutes  Recommendations for Outpatient Follow-up:  1. Follow-up with primary care physician. 2. Follow-up with ID as outpatient, histoplasma, Dengue and Chickungunya serology is pending   Discharge Diagnoses:  Active Problems:   Neck pain   Sore throat (viral)   Rhabdomyolysis   Acute lymphadenitis   Viral infection   Discharge Condition: Stable  Diet recommendation: Regular  Filed Weights   09/26/15 0546 09/27/15 0628 09/28/15 0500  Weight: 66.769 kg (147 lb 3.2 oz) 64.592 kg (142 lb 6.4 oz) 64.683 kg (142 lb 9.6 oz)    History of present illness:  William Cordova is a 20 y.o. male with a past medical history significant for concussion who presents with 3 days headache and fever.  The patient was in his usual state of health until about three days ago when he developed posterior neck pain. This progressed the next day to headache and low grade fever, and then today to severe neck ache, back ache, pain with swallowing, headache, and fever alternating with drenching sweats and chills. The pain was more severe than a broken bone, so he had his girlfriend drive him home from college at [employer] to go to the hospital.   Of note, the patient was in the [place] on a cruise ship three weeks ago. During that time, [place], including one [place] exploration with a group and guide, in close proximity to bats. He had a sore throat during that trip, but nothing else. He has had no other travel in the last year outside the [place] ([place], [place], and [place]). No recent mountain travel. No illicit drugs other than THC.  In the ED, the patient had fever to 101F, mild tachycardia, mildly elevated transaminases and leukocytosis. An LP was traumatic with only 1 WBC and normal glucose and protein.  This was sent for culture and HSV PCR, and empiric vancomycin and ceftriaxone were started. The case was discussed with [facility] ID by phone, who recommended transfer for evaluation.  Hospital Course:   Pharyngitis -Patient presented with headache, fever, sweats and body aches, ID was consulted suspected viral infection. -Has negative HIV, CSF is negative for meningitis. -Patient is very slow to recover, still reporting right-sided neck pain. -Negative orthopantogram. -ENT reporting he has cervical lymphadenitis and retropharyngeal cellulitis. -Repeat CT is showing no fluids collection, lymphadenpathy compatible with upper respiratory tract infection. -Still has some generalized symptoms like body aches and right neck pain. -His girlfriend reported that he was in a bats cave in the [place]. -I have asked ID to evaluate him again, they sent Dengue and Chickungunya serology. -Discharged on clindamycin for 5 more days, along with muscle relaxants, steroid taper and Norco (prescribed 15 pills)  Neck pain-severe in nature -no neurologic symptoms of numbness or weakness -CT scan done at [facility] -MRI as below -Patient mother was in the room all the time, she pretty much answers the questions for him. -Mother resisted the discharge more than once, reporting that he is very sick and very weak. -His mother asked me to write a letter for his college.  MRI shows pharyngitis with small phlegmon- -Start clindmyacin as has been on amoxicillin and augmentin in last 30 days -appreciate ENT -no stridor or drooling to suggest Retropharyngeal abscess, repeat CT scan showed result evaluation of fluids.  LFTs:  Suspect due rhabdo -  resolved  rhabdo- recently increased work out -Rhabdomyolysis without acute renal failure, treated with IV fluids, CK returned to normal with 91 on discharge.  [facility] counseled on cessation  Constipation -encouraged  ambulation -senna   Procedures:  None  Consultations:  Infectious disease.  ENT.  Discharge Exam: Filed Vitals:   09/27/15 2028 09/28/15 0500  BP: 114/59 110/56  Pulse: 53 41  Temp: 97.9 F (36.6 C) 98 F (36.7 C)  Resp: 18    General: Alert and awake, oriented x3, not in any acute distress. HEENT: anicteric sclera, pupils reactive to light and accommodation, EOMI CVS: S1-S2 clear, no murmur rubs or gallops Chest: clear to auscultation bilaterally, no wheezing, rales or rhonchi Abdomen: soft nontender, nondistended, normal bowel sounds, no organomegaly Extremities: no cyanosis, clubbing or edema noted bilaterally Neuro: Cranial nerves II-XII intact, no focal neurological deficits  Discharge Instructions   Discharge Instructions    Diet - low sodium heart healthy    Complete by:  As directed      Increase activity slowly    Complete by:  As directed           Current Discharge Medication List    START taking these medications   Details  clindamycin (CLEOCIN) 300 MG capsule Take 1 capsule (300 mg total) by mouth 3 (three) times daily. Qty: 15 capsule, Refills: 0    HYDROcodone-acetaminophen (NORCO) 5-325 MG tablet Take 1 tablet by mouth every 6 (six) hours as needed for moderate pain. Qty: 15 tablet, Refills: 0    methocarbamol (ROBAXIN) 500 MG tablet Take 1 tablet (500 mg total) by mouth every 8 (eight) hours as needed for muscle spasms. Qty: 30 tablet, Refills: 0    predniSONE (DELTASONE) 10 MG tablet Take 6-5-4-3-2-1 tablets PO till gone Qty: 21 tablet, Refills: 0       No Known Allergies Follow-up Information    Follow up with [facility] Angelique Blonder., MD In 1 week.   Specialty:  Family Medicine   Contact information:   [place] [phone]       Follow up with Carlyle Basques, MD In 1 week.   Specialty:  Infectious Diseases   Contact information:   [address] [place] [address] [phone]         The results of significant diagnostics from this hospitalization (including imaging, microbiology, ancillary and laboratory) are listed below for reference.    Significant Diagnostic Studies: Dg Orthopantogram  09/24/2015  CLINICAL DATA:  Right-sided jaw pain 3 days.  No injury. EXAM: ORTHOPANTOGRAM/PANORAMIC COMPARISON:  None. FINDINGS: Mandible is within normal. No significant evidence of dental caries or periodontal disease. IMPRESSION: No acute findings. Electronically Signed   By: Marin Olp M.D.   On: 09/24/2015 15:08   Ct Soft Tissue Neck W Contrast  09/25/2015  CLINICAL DATA:  Neck pain and throat pain.  Difficulty swallowing. EXAM: CT NECK WITH CONTRAST TECHNIQUE: Multidetector CT imaging of the neck was performed using the standard protocol following the bolus administration of intravenous contrast. CONTRAST:  70mL OMNIPAQUE IOHEXOL 300 MG/ML  SOLN COMPARISON:  None. FINDINGS: Pharynx and larynx: There is moderate prominence the palatine tonsils, the lingual tonsils, adenoid tissue. The soft palate contacts the posterior nasopharynx. The airway is patent. The parapharyngeal fat is clear. No discrete mucosal or submucosal lesions are present. The vocal cords are midline symmetric. Salivary glands: The submandibular and parotid glands are within normal limits bilaterally. Thyroid: Negative Lymph nodes: Multiple bilateral  submandibular lymph nodes are present. A posterior right level 2 lymph node measures 17 x 11 mm on the sagittal reformats. A posterior left level 2 lymph node measures 8 x 18 mm. There is mild prominence of jugulodigastric lymph node bilaterally. Vascular: Within normal limits. The right sigmoid sinus and internal jugular vein is much smaller than on the left. Prominent external jugular veins are present bilaterally. Limited intracranial: Negative Visualized orbits: Within normal limits. Mastoids and visualized paranasal sinuses: Clear Skeleton: Negative Upper chest:  Clear IMPRESSION: 1. Prominence of lymphoid tissue in Waldeyer's ring and bilateral reactive lymphadenopathy compatible with typical upper respiratory infection. 2. No discrete abscess or mass lesion. Electronically Signed   By: San Morelle M.D.   On: 09/25/2015 17:04   Mr Cervical Spine Wo Contrast  09/22/2015  CLINICAL DATA:  Fever and diffuse neck pain, 2 days duration. EXAM: MRI CERVICAL SPINE WITHOUT CONTRAST TECHNIQUE: Multiplanar, multisequence MR imaging of the cervical spine was performed. No intravenous contrast was administered. COMPARISON:  None. FINDINGS: Alignment of the spine is normal. No degenerative change. Spinal canal and foramina are widely patent. No cord lesion. Abnormal fluid in the retropharyngeal/prevertebral space extending from C2 to C6 consistent with pharyngitis. Reactive cervical lymphadenopathy is noted including a suppurative node on the right between the vessels and the longus coli muscle measuring 3 cm in length with a transverse diameter of 14 mm, consistent with the retropharyngeal node of Rouviere. IMPRESSION: Suppurative retropharyngeal lymph node of Rouviere on the right with retropharyngeal phlegmonous inflammation/effusion. Electronically Signed   By: Nelson Chimes M.D.   On: 09/22/2015 14:44    Microbiology: Recent Results (from the past 240 hour(s))  Malaria smear     Status: None   Collection Time: 09/21/15  4:17 AM  Result Value Ref Range Status   Specimen Description BLOOD  Final   Special Requests Normal  Final   Malaria Prep SEE SEPARATE REPORT  Final   Report Status 09/26/2015 FINAL  Final     Labs: Basic Metabolic Panel:  Recent Labs Lab 09/22/15 0345 09/23/15 0530 09/27/15 1453  NA 141 140 138  K 4.3 4.2 4.8  CL 104 107 100*  CO2 26 25 26   GLUCOSE 96 87 126*  BUN 6 6 16   CREATININE 1.05 0.92 1.13  CALCIUM 8.7* 8.7* 10.3   Liver Function Tests:  Recent Labs Lab 09/22/15 0345 09/27/15 1453  AST 69* 146*  ALT 54 151*   ALKPHOS 42 66  BILITOT 0.4 0.7  PROT 5.1* 7.7  ALBUMIN 2.7* 4.3   No results for input(s): LIPASE, AMYLASE in the last 168 hours. No results for input(s): AMMONIA in the last 168 hours. CBC:  Recent Labs Lab 09/22/15 0345 09/23/15 0530 09/27/15 1453  WBC 7.4 5.6 15.0*  NEUTROABS  --   --  13.8*  HGB 11.7* 12.3* 15.6  HCT 33.4* 36.8* 43.3  MCV 82.9 83.4 80.9  PLT 201 209 415*   Cardiac Enzymes:  Recent Labs Lab 09/21/15 1245 09/22/15 0345 09/23/15 0530 09/27/15 1453  CKTOTAL 3658* 2676* 1871* 91   BNP: BNP (last 3 results) No results for input(s): BNP in the last 8760 hours.  ProBNP (last 3 results) No results for input(s): PROBNP in the last 8760 hours.  CBG: No results for input(s): GLUCAP in the last 168 hours.     Signed:  Birdie Hopes MD.  [facility] 09/28/2015, 11:29 AM

## 2015-09-28 NOTE — Progress Notes (Signed)
Patient was discharge to home at 12:20 pm accompanied by patient's mother and NT via wheelchair/private car. Presriptions and discharge instructions given. Patient and patient's mother verbalized understanding. All personal belongings given. IV removed prior to discharge . IV site in good condition.

## 2015-09-30 ENCOUNTER — Telehealth: Payer: Self-pay | Admitting: *Deleted

## 2015-09-30 LAB — HISTOPLASMA ANTIBODIES: Mycelia phase ab: <1:8 {titer}

## 2015-09-30 NOTE — Telephone Encounter (Signed)
Wanting to speak with Dr. Baxter Flattery re:  D/C plan of care.  RN spoke with Dr. Baxter Flattery and gave her Dr. Welford Roche' cell phone number [phone].

## 2015-10-01 ENCOUNTER — Other Ambulatory Visit: Payer: Self-pay | Admitting: Internal Medicine

## 2015-10-01 DIAGNOSIS — Z789 Other specified health status: Secondary | ICD-10-CM

## 2015-10-03 LAB — HISTOPLASMA ANTIGEN, URINE: HISTOPLASMA ANTIGEN URINE: 0 ng/mL (ref 0.00–0.49)

## 2015-10-14 LAB — MISCELLANEOUS TEST
Miscellaneous Test Results: NEGATIVE
Miscellaneous Test: NEGATIVE
Miscellaneous Test: NEGATIVE

## 2015-10-15 ENCOUNTER — Encounter (HOSPITAL_COMMUNITY): Payer: Self-pay

## 2015-10-23 ENCOUNTER — Inpatient Hospital Stay: Payer: BLUE CROSS/BLUE SHIELD | Admitting: Internal Medicine

## 2016-12-25 IMAGING — CT CT NECK W/ CM
4 of 5 series · 16 of 33 positions shown, 18 images · IV contrast (Omni 300)
Comparison: None.

CLINICAL DATA: Neck pain and throat pain.  Difficulty swallowing.

EXAM:
CT NECK WITH CONTRAST
TECHNIQUE: Multidetector CT imaging of the neck was performed using the
standard protocol following the bolus administration of intravenous
contrast.
CONTRAST:  75mL OMNIPAQUE IOHEXOL 300 MG/ML  SOLN

[Series 4: neck 2.0 st · axial · 0.36mm/px · z∈[+150,+282]mm · 4 of 112 slices shown, 5 images (1 of 3)]
[im 23/112  soft-tissue]
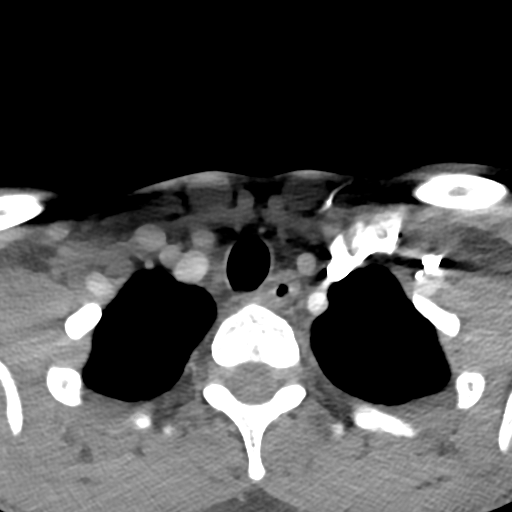
[im 23/112  bone]
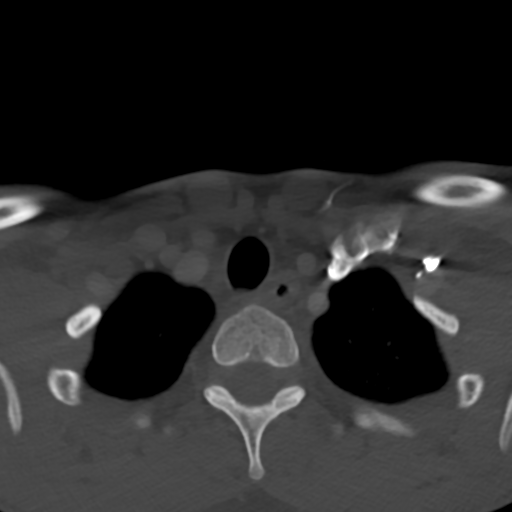
[im 45/112  bone]
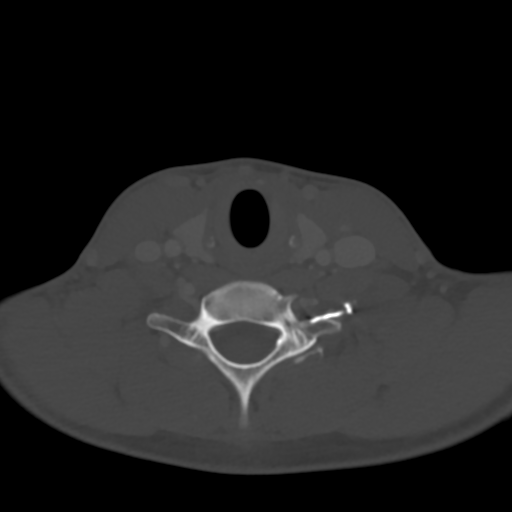
[im 67/112  bone]
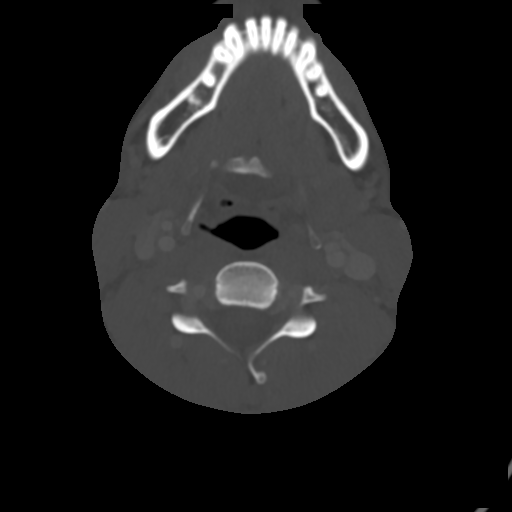
[im 89/112  bone]
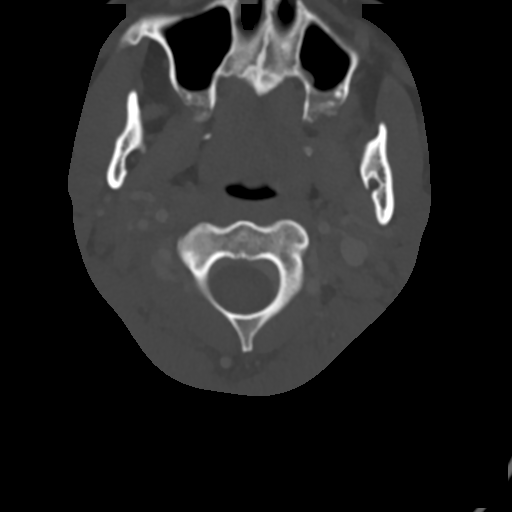

[Series 6: neck 2.0 st · sagittal · 0.43mm/px · 5 of 74 slices shown, 6 images (2 of 3)]
[im 25/74  bone]
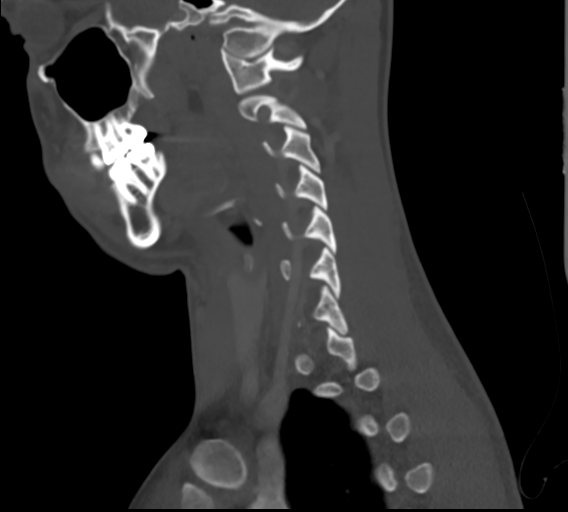
[im 31/74  bone]
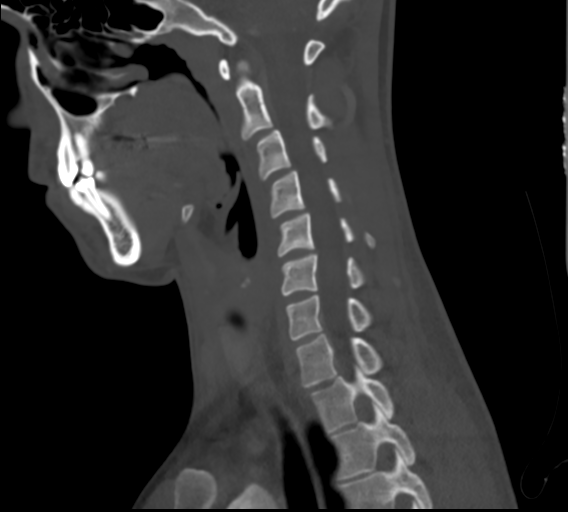
[im 37/74  soft-tissue]
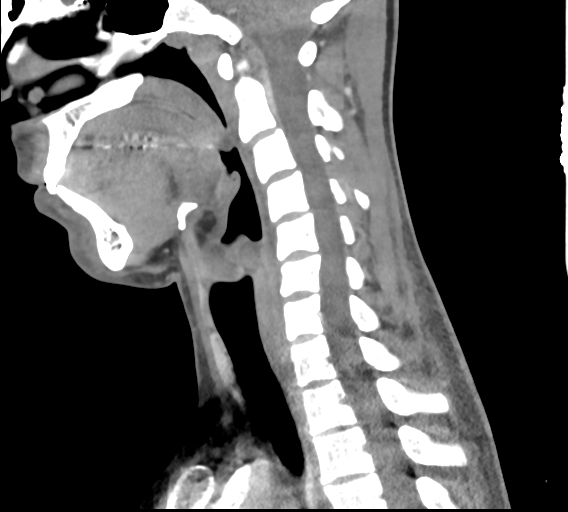
[im 37/74  bone]
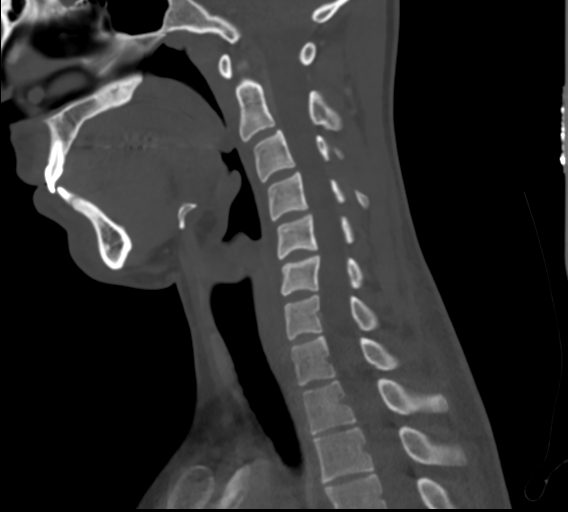
[im 43/74  bone]
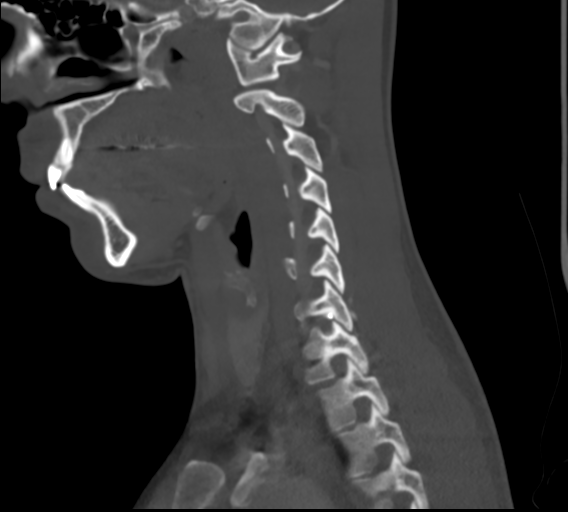
[im 49/74  bone]
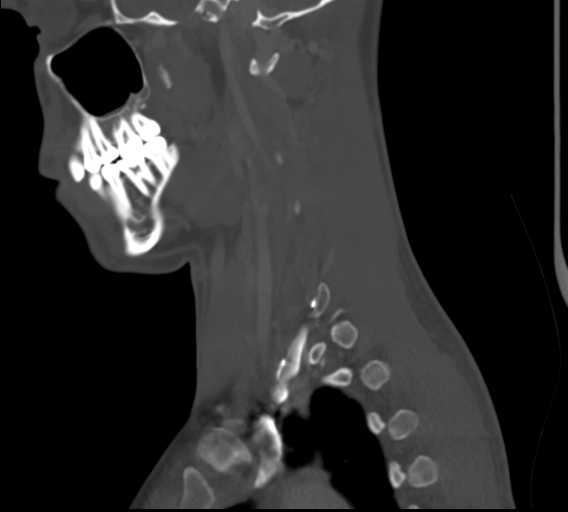

[Series 7: neck 2.0 st · coronal · 0.43mm/px · 3 of 106 slices shown (3 of 3)]
[im 22/106  bone]
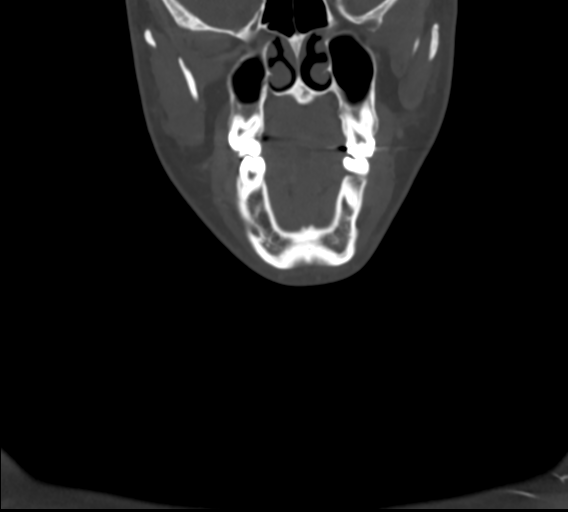
[im 43/106  bone]
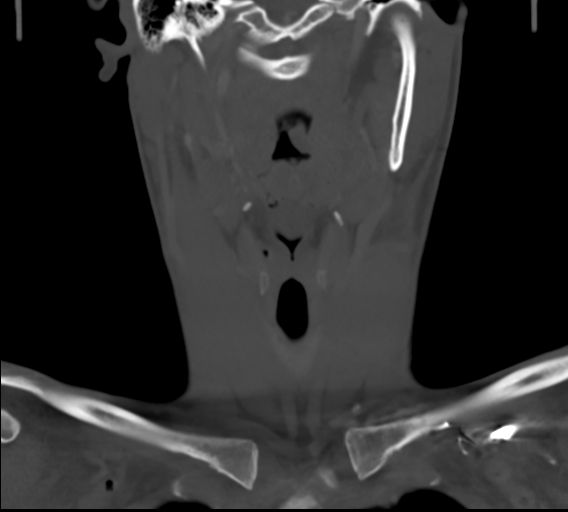
[im 64/106  bone]
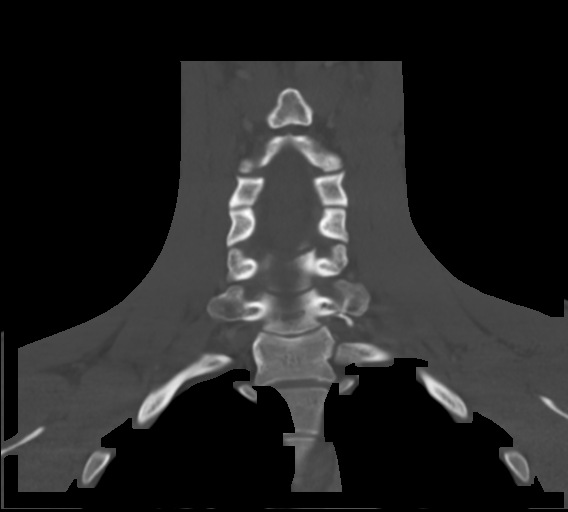

[Series 8: neck 2.0 st orthogonal · axial · 0.39mm/px · z∈[+144,+276]mm · 4 of 110 slices shown]
[im 22/110  bone]
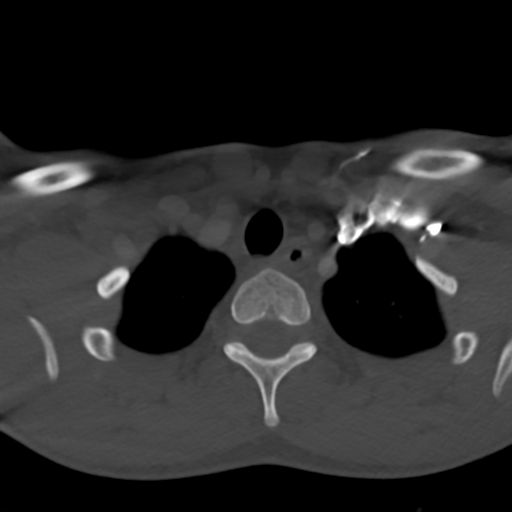
[im 44/110  bone]
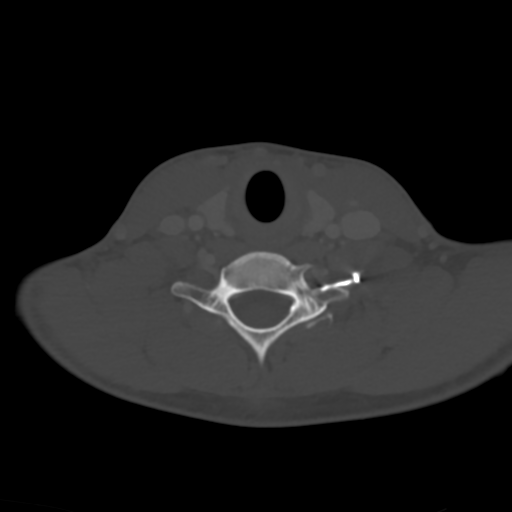
[im 66/110  bone]
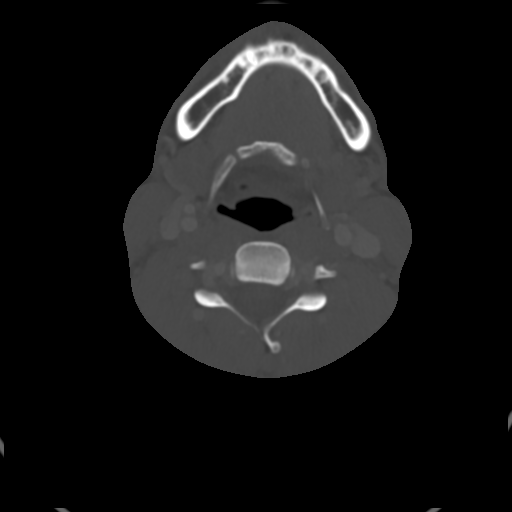
[im 88/110  bone]
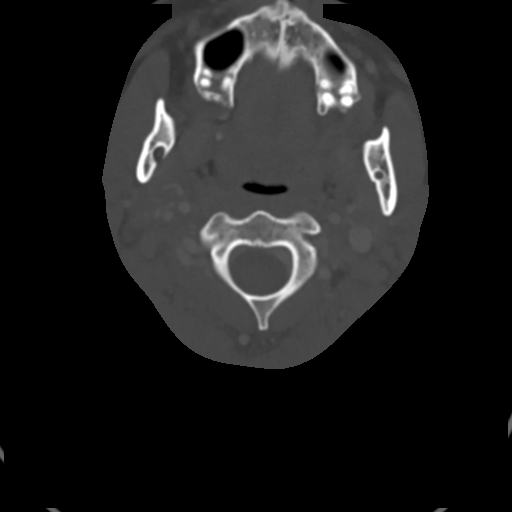

[16 of 33 positions shown; findings below may reference images not displayed]

FINDINGS: Pharynx and larynx: There is moderate prominence the palatine
tonsils, the lingual tonsils, adenoid tissue. The soft palate
contacts the posterior nasopharynx. The airway is patent. The
parapharyngeal fat is clear. No discrete mucosal or submucosal
lesions are present. The vocal cords are midline symmetric.

Salivary glands: The submandibular and parotid glands are within
normal limits bilaterally.

Thyroid: Negative

Lymph nodes: Multiple bilateral submandibular lymph nodes are
present. A posterior right level 2 lymph node measures 17 x 11 mm on
the sagittal reformats. A posterior left level 2 lymph node measures
8 x 18 mm. There is mild prominence of jugulodigastric lymph node
bilaterally.

Vascular: Within normal limits. The right sigmoid sinus and internal
jugular vein is much smaller than on the left. Prominent external
jugular veins are present bilaterally.

Limited intracranial: Negative

Visualized orbits: Within normal limits.

Mastoids and visualized paranasal sinuses: Clear

Skeleton: Negative

Upper chest: Clear
IMPRESSION: 1. Prominence of lymphoid tissue in Waldeyer's ring and bilateral
reactive lymphadenopathy compatible with typical upper respiratory
infection.
2. No discrete abscess or mass lesion.

## 2017-01-26 DIAGNOSIS — J329 Chronic sinusitis, unspecified: Secondary | ICD-10-CM | POA: Diagnosis not present

## 2017-01-26 DIAGNOSIS — Z6820 Body mass index (BMI) 20.0-20.9, adult: Secondary | ICD-10-CM | POA: Diagnosis not present

## 2017-02-21 DIAGNOSIS — R070 Pain in throat: Secondary | ICD-10-CM | POA: Diagnosis not present

## 2017-10-01 DIAGNOSIS — J0120 Acute ethmoidal sinusitis, unspecified: Secondary | ICD-10-CM | POA: Diagnosis not present

## 2017-10-01 DIAGNOSIS — J4520 Mild intermittent asthma, uncomplicated: Secondary | ICD-10-CM | POA: Diagnosis not present

## 2017-10-01 DIAGNOSIS — Z6820 Body mass index (BMI) 20.0-20.9, adult: Secondary | ICD-10-CM | POA: Diagnosis not present

## 2017-11-01 DIAGNOSIS — S62306A Unspecified fracture of fifth metacarpal bone, right hand, initial encounter for closed fracture: Secondary | ICD-10-CM | POA: Diagnosis not present

## 2017-11-09 DIAGNOSIS — S62306A Unspecified fracture of fifth metacarpal bone, right hand, initial encounter for closed fracture: Secondary | ICD-10-CM | POA: Diagnosis not present

## 2017-11-18 DIAGNOSIS — S62306A Unspecified fracture of fifth metacarpal bone, right hand, initial encounter for closed fracture: Secondary | ICD-10-CM | POA: Diagnosis not present

## 2017-11-24 DIAGNOSIS — S62326G Displaced fracture of shaft of fifth metacarpal bone, right hand, subsequent encounter for fracture with delayed healing: Secondary | ICD-10-CM | POA: Diagnosis not present

## 2017-11-30 DIAGNOSIS — S62326A Displaced fracture of shaft of fifth metacarpal bone, right hand, initial encounter for closed fracture: Secondary | ICD-10-CM | POA: Diagnosis not present

## 2017-11-30 DIAGNOSIS — M9689 Other intraoperative and postprocedural complications and disorders of the musculoskeletal system: Secondary | ICD-10-CM | POA: Diagnosis not present

## 2017-11-30 DIAGNOSIS — S62326G Displaced fracture of shaft of fifth metacarpal bone, right hand, subsequent encounter for fracture with delayed healing: Secondary | ICD-10-CM | POA: Diagnosis not present

## 2017-11-30 DIAGNOSIS — S62306A Unspecified fracture of fifth metacarpal bone, right hand, initial encounter for closed fracture: Secondary | ICD-10-CM | POA: Diagnosis not present

## 2017-11-30 DIAGNOSIS — M96830 Postprocedural hemorrhage and hematoma of a musculoskeletal structure following a musculoskeletal system procedure: Secondary | ICD-10-CM | POA: Diagnosis not present

## 2017-11-30 DIAGNOSIS — M96840 Postprocedural hematoma of a musculoskeletal structure following a musculoskeletal system procedure: Secondary | ICD-10-CM | POA: Diagnosis not present

## 2017-12-01 DIAGNOSIS — Z4789 Encounter for other orthopedic aftercare: Secondary | ICD-10-CM | POA: Diagnosis not present

## 2018-03-04 DIAGNOSIS — S62326D Displaced fracture of shaft of fifth metacarpal bone, right hand, subsequent encounter for fracture with routine healing: Secondary | ICD-10-CM | POA: Diagnosis not present

## 2018-07-11 DIAGNOSIS — D225 Melanocytic nevi of trunk: Secondary | ICD-10-CM | POA: Diagnosis not present

## 2018-07-11 DIAGNOSIS — D2239 Melanocytic nevi of other parts of face: Secondary | ICD-10-CM | POA: Diagnosis not present

## 2018-07-11 DIAGNOSIS — D485 Neoplasm of uncertain behavior of skin: Secondary | ICD-10-CM | POA: Diagnosis not present

## 2018-08-09 DIAGNOSIS — Z2821 Immunization not carried out because of patient refusal: Secondary | ICD-10-CM | POA: Diagnosis not present

## 2018-08-09 DIAGNOSIS — J329 Chronic sinusitis, unspecified: Secondary | ICD-10-CM | POA: Diagnosis not present

## 2018-08-09 DIAGNOSIS — Z6823 Body mass index (BMI) 23.0-23.9, adult: Secondary | ICD-10-CM | POA: Diagnosis not present
